# Patient Record
Sex: Female | Born: 1940 | Race: Black or African American | Hispanic: No | Smoking: Former smoker
Health system: Southern US, Community
[De-identification: ages and names within clinical notes are randomized; demographics above are authoritative.]

## PROBLEM LIST (undated history)

## (undated) DIAGNOSIS — Z8639 Personal history of other endocrine, nutritional and metabolic disease: Secondary | ICD-10-CM

## (undated) DIAGNOSIS — I10 Essential (primary) hypertension: Secondary | ICD-10-CM

## (undated) HISTORY — PX: BREAST SURGERY: SHX581

## (undated) HISTORY — PX: ABDOMINAL HYSTERECTOMY: SHX81

## (undated) HISTORY — PX: THYROIDECTOMY, PARTIAL: SHX18

## (undated) HISTORY — PX: BACK SURGERY: SHX140

---

## 1999-08-10 ENCOUNTER — Encounter: Payer: Self-pay | Admitting: Emergency Medicine

## 1999-08-10 ENCOUNTER — Encounter: Payer: Self-pay | Admitting: Cardiology

## 1999-08-10 ENCOUNTER — Inpatient Hospital Stay (HOSPITAL_COMMUNITY): Admission: EM | Admit: 1999-08-10 | Discharge: 1999-08-11 | Payer: Self-pay | Admitting: Emergency Medicine

## 1999-08-11 ENCOUNTER — Encounter: Payer: Self-pay | Admitting: Cardiology

## 2003-11-30 ENCOUNTER — Emergency Department (HOSPITAL_COMMUNITY): Admission: EM | Admit: 2003-11-30 | Discharge: 2003-12-01 | Payer: Self-pay | Admitting: Emergency Medicine

## 2004-08-25 ENCOUNTER — Ambulatory Visit (HOSPITAL_COMMUNITY): Admission: RE | Admit: 2004-08-25 | Discharge: 2004-08-25 | Payer: Self-pay | Admitting: Cardiology

## 2004-12-19 ENCOUNTER — Emergency Department (HOSPITAL_COMMUNITY): Admission: EM | Admit: 2004-12-19 | Discharge: 2004-12-19 | Payer: Self-pay | Admitting: *Deleted

## 2004-12-21 ENCOUNTER — Ambulatory Visit: Payer: Self-pay | Admitting: Internal Medicine

## 2004-12-28 ENCOUNTER — Ambulatory Visit: Admission: RE | Admit: 2004-12-28 | Discharge: 2004-12-28 | Payer: Self-pay | Admitting: Internal Medicine

## 2004-12-28 ENCOUNTER — Ambulatory Visit: Payer: Self-pay | Admitting: Internal Medicine

## 2004-12-28 ENCOUNTER — Encounter (INDEPENDENT_AMBULATORY_CARE_PROVIDER_SITE_OTHER): Payer: Self-pay | Admitting: *Deleted

## 2005-01-01 ENCOUNTER — Encounter (INDEPENDENT_AMBULATORY_CARE_PROVIDER_SITE_OTHER): Payer: Self-pay | Admitting: Specialist

## 2005-01-01 ENCOUNTER — Other Ambulatory Visit: Admission: RE | Admit: 2005-01-01 | Discharge: 2005-01-01 | Payer: Self-pay | Admitting: Interventional Radiology

## 2005-01-01 ENCOUNTER — Encounter: Admission: RE | Admit: 2005-01-01 | Discharge: 2005-01-01 | Payer: Self-pay | Admitting: Otolaryngology

## 2005-01-15 ENCOUNTER — Ambulatory Visit: Payer: Self-pay | Admitting: Internal Medicine

## 2005-02-08 ENCOUNTER — Ambulatory Visit (HOSPITAL_COMMUNITY): Admission: RE | Admit: 2005-02-08 | Discharge: 2005-02-09 | Payer: Self-pay | Admitting: Otolaryngology

## 2005-02-08 ENCOUNTER — Encounter (INDEPENDENT_AMBULATORY_CARE_PROVIDER_SITE_OTHER): Payer: Self-pay | Admitting: Specialist

## 2005-02-12 ENCOUNTER — Ambulatory Visit: Payer: Self-pay | Admitting: Internal Medicine

## 2005-12-20 ENCOUNTER — Inpatient Hospital Stay (HOSPITAL_BASED_OUTPATIENT_CLINIC_OR_DEPARTMENT_OTHER): Admission: RE | Admit: 2005-12-20 | Discharge: 2005-12-20 | Payer: Self-pay | Admitting: Cardiology

## 2006-05-16 ENCOUNTER — Encounter: Admission: RE | Admit: 2006-05-16 | Discharge: 2006-05-16 | Payer: Self-pay | Admitting: Otolaryngology

## 2007-08-21 ENCOUNTER — Encounter: Admission: RE | Admit: 2007-08-21 | Discharge: 2007-08-21 | Payer: Self-pay | Admitting: Gastroenterology

## 2009-06-01 ENCOUNTER — Emergency Department (HOSPITAL_COMMUNITY): Admission: EM | Admit: 2009-06-01 | Discharge: 2009-06-01 | Payer: Self-pay | Admitting: Family Medicine

## 2010-04-02 ENCOUNTER — Encounter: Payer: Self-pay | Admitting: Cardiology

## 2010-07-28 NOTE — Op Note (Signed)
NAME:  [name], [name]              ACCOUNT NO.:  [id number]   MEDICAL RECORD NO.:  [id number]          PATIENT TYPE:  OIB   LOCATION:  5713                         FACILITY:  [facility]   PHYSICIAN:  Hermelinda Medicus, M.D.   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   OPERATION:  Left subtotal thyroidectomy.   OPERATOR:  Hermelinda Medicus, MD   ASSISTANT:  Narda Bonds, MD   ANESTHESIA:  General endotracheal with Dr. Katrinka Blazing.   PROCEDURE:  The patient was placed in supine position, elevating her  shoulders and with the neck exposed, shoulders level and head at the  midline.  The left side will be our approach side for this mass.  The  patient was prepped and draped in the appropriate manner using Betadine and  the usual thyroid head drape was used.  A marking pen was used for a low  thyroid incision and this was carried out using a Bard-Parker blade and then  Bovie electrocoagulation was used for hemostasis and the superior and  inferior flaps of skin and platysma were elevated and the thyroid retractor  was placed.  The midline of the strap muscles was then found, the strap  muscles were separated and the thyroid, which was found to be much larger on  the left side than on the right, was easily identified.  The isthmus was  then found, which was slightly toward the left and the right thyroid gland  was saved and untouched.  The left this side was then mobilized, mobilized  inferiorly using the Bovie coagulation as hemostasis and the small vessels  also were Bovie-electrocoagulated and  tied with 2-0 silk.  The inferior  thyroid artery was visualized and found and tied with 2-0 silk.  The  superior aspect of this thyroid was somewhat adherent; however, we were able  to develop this from its deep location around the esophagus and developed it  up on the back side of the esophagus and we did not have to extend into the  tracheoesophageal groove; we stayed very close to the thyroid tissue and did  not identify the recurrent nerve.  We worked along superiorly, found the  ligament of Allyson Sabal and then we found the superior thyroid vasculature and  crossclamped and cut this, and tied this again with 2-0 silk.  We were able  to dissect this off the trachea without difficulty using the Bovie  electrocoagulation and blunt dissection, and tying with 2-0 silk.  Once this  lesion was removed, we then were very careful to examine the right cut  section of the thyroid at the isthmus and then the inferior and superior  areas where we removed  this mass from, which was quite sizable at 3.9 cm.  Once all hemostasis was established, we did place a small amount of Surgicel  down the inferior and lateral regions and then before that we irrigated and  checked for any hemostasis and used Bovie electrocoagulation to correct any  small problem that was visible.  Also, a 7 flat perforated Jackson-Pratt  drain was placed and then closure was begun using chromic catgut at 2-0 and  then the subcutaneous was closed with chromic catgut and then the skin was  closed with continuous of 5-0 Ethilon.  The Jackson-Pratt was sutured in  place with a 0 silk and then taped in place, and Steri-Strips were applied.  The patient was taken to the recovery room in good  condition and has shown no evidence of any bleeding problems.  The patient  will be kept overnight and monitored, and then hopefully be sent home  tomorrow after removal of the Jackson-Pratt.  The patient tolerated  procedure well and is doing well.   Her followup will be then in 1 week, 3  weeks, 6 weeks, 3 months and 6 months.           ______________________________  Hermelinda Medicus, M.D.     JC/MEDQ  D:  02/08/2005  T:  02/09/2005  Job:  [id number]   cc:   Charlaine Dalton. Sherene Sires, M.D. [facility]  [address]  [place]  [place] [address]   Eduardo Osier. Sharyn Lull, M.D.  Fax: [phone]   Kristine Garbe. Ezzard Standing, M.D.  Fax: [phone]

## 2010-07-28 NOTE — Discharge Summary (Signed)
[place]  Patient:    [name], [name]                     MRN: [id number] Adm. Date:  [id number] Disc. Date: [id number] Attending:  Ronaldo Miyamoto Dictator:   Joellyn Rued, P.A.C.                           Discharge Summary  SUMMARY OF HISTORY:  [name] is a 70 year old black female without prior cardiac history.  She has not seen a physician in more than two years.  Prior to that, he was seeing Dr. Victorino Dike for allergies.  She presents with a history of intermittent sharp, but sometimes pressure-like chest discomfort lasting only seconds, not associated with diaphoresis, shortness of breath, nausea, vomiting, o radiation.  She occasionally notices left shoulder pain and discomfort beneath er left breast, which is less intense and more constant.  They are not associated ith activity or any type of exertion.  She cannot recall any alleviating/aggravating factors.  The intensity and frequency has increased over the last four weeks. Thus, her presentation.  She has a history of hyperlipidemia, last checked at [facility], back surgery, hysterectomy, benign right breast cyst, seasonal allergies.   LABORATORY DATA:  Urinalysis showed a moderate amount of hemoglobin.  Fasting lipid showed total cholesterol 269, triglycerides 121, HDL 74, LDL 171. CKs and troponins were negative for myocardial infarction.  Sodium 139, potassium 4.4, BUN 15, creatinine 0.6, glucose 94.  Hemoglobin 13.1, hematocrit 39.9, normal indices, platelet count 213,000, WBC 6.5.  Chest x-ray did not reveal any abnormalities.  HOSPITAL COURSE:  [name] was admitted to the hospital overnight.  She continued to have some intermittent chest discomfort. Enzymes and EKGs were negative for myocardial infarction.  Stress Cardiolite was performed utilizing the Bruce protocol.  She achieved greater than her 85% predicted maximum heart rate.  She did have a  hypertensive response of 220/100; did have some chest discomfort, resolved with rest.  EKG did not show any changes.  Imaging showed an ejection fraction f 73%, no ischemia.  Thus, she was discharged home with a diagnosis of noncardiac  chest discomfort.  As far as disposition and plan, the discharge sheet with medications and followup is not on the chart. DD:  08/29/99 TD:  08/29/99 Job: [id number]

## 2010-07-28 NOTE — H&P (Signed)
NAME:  [name], [name]              ACCOUNT NO.:  [id number]   MEDICAL RECORD NO.:  [id number]          PATIENT TYPE:  OIB   LOCATION:  2550                         FACILITY:  [facility]   PHYSICIAN:  Hermelinda Medicus, M.D.   DATE OF BIRTH:  July 04, 1940   DATE OF ADMISSION:  02/08/2005  DATE OF DISCHARGE:                                HISTORY & PHYSICAL   This patient is a 70 year old female who I saw who never smoked in the past,  but I saw in the emergency room where she had been coughing up some blood  and this has been checked where her lungs showed some bronchiectasis on CT  scan on December 19, 2004.  Bronchoscopy was performed on October 19,  revealed the airways were widely patent and the subsegmental bilaterally  showed some left erythema.  However, the lungs were cleared of their disease  process including a malignancy.  She also had a large left thyroid lesion  which was pushing against the esophagus and was causing her some swallowing  difficulty and was also pushing her trachea to the right.  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  The plan  is to remove this left thyroid mass in an effort to improve her swallowing  and also resolve the potential tumor process in this increasing size thyroid  mass.   PAST MEDICAL HISTORY:  Allergic to CODEINE.  She drinks alcohol  occasionally.  Never smoked.  She has had a hysterectomy and two back  surgeries.  She does have some hypertension which is well under control on  Toprol XL 100 mg daily.  She has had this bronchiectasis completely worked  up at this point and her bowel, bladder, kidney, respiratory are otherwise  unremarkable.   PHYSICAL EXAMINATION:  VITAL SIGNS:  Blood pressure 136/71 with a heart rate  of 67.  She weighs 125 and has a respiratory rate of 20.  HEENT:  Ears are clear.  Tympanic membranes are clear.  Her external ear  canals are clear.  The nose is clear of any ulceration  or masses as is the  nasopharynx.  Her larynx I can see very well, appears to be pushed to the  right slightly but true vocal cords move well.  True cords, false cords,  epiglottis, base of tongue are clear.  True cord mobility, gag reflex,  tongue mobility, EOMs, facial nerve are all symmetrical.  NECK:  Free of any cervical adenopathy, but shows the thyromegaly on the  left side.  CHEST:  Clear.  No rales, rhonchi, or wheezes.  CARDIOVASCULAR:  No opening snaps, murmurs, or gallops.  EKG noted.  ABDOMEN:  Unremarkable.  EXTREMITIES:  Unremarkable.   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  History of  bronchiectasis.  History of hysterectomy and two back surgeries.  History of  blood pressure elevation.   MEDICATIONS:  1.  Toprol XL 100 mg p.o. daily.  2.  Crestor 10 mg daily.  3.  Mucinex p.r.n.           ______________________________  Hermelinda Medicus, M.D.     JC/MEDQ  D:  02/08/2005  T:  02/08/2005  Job:  [id number]   cc:   Charlaine Dalton. Sherene Sires, M.D. [facility]  [address]  [place]  [place] [address]   Eduardo Osier. Sharyn Lull, M.D.  Fax: [phone]

## 2010-07-28 NOTE — Consult Note (Signed)
NAME:  [name], [name]              ACCOUNT NO.:  [id number]   MEDICAL RECORD NO.:  [id number]          PATIENT TYPE:  EMS   LOCATION:  MAJO                         FACILITY:  [facility]   PHYSICIAN:  Hermelinda Medicus, M.D.   DATE OF BIRTH:  05/24/1940   DATE OF CONSULTATION:  12/19/2004  DATE OF DISCHARGE:  12/19/2004                                   CONSULTATION   This patient is a 70 year old female who has been in excellent health in the  past, has never smoked. She was coming home from work and she felt she was  spitting up some material which turned out to be blood. She brought up  approximately 75 cc of blood into a coffee cup. She had never had this  happen before. She did not have any aspiration or choking spell but was  concerned about this. She came to the emergency room. She brought the cup in  but has not coughed up any more blood and her workup has shown her  hematocrit is 43.0, hemoglobin 14.1, and her white count is 6.9, INR is 0.9.  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 She has had a  chest x-ray which has shown mild bronchitis, pulse oximetry 96-97% and she  at this time on my examination she has had no evidence of any blood in her  throat or nose. She claims she did not ever have any nasal bleeding.   She is allergic to CODEINE.   She takes 81 of aspirin, 50 mg of Toprol XL, 10 mg of Crestor all daily.   She has some abdominal gastrointestinal complaints in the past.   On further examination, her blood pressure is 175/90, pulse 85, and her ears  are clear. Oral cavity appears to be clear. She has dentures. She has no  evidence of any ulceration or mass. Tonsils are clear. Nasopharynx is clear  and is extremely cooperative. Nasopharynx I could see reasonably well. The  larynx is clear of any ulceration or mass. True cords, false cords,  epiglottis, base of tongue, lateral pharyngeal walls all appear to be clear  of any ulceration or mass. I could  see very well total vocal cords, total  lingual laryngeal surface of the epiglottis and I can see right down her  trachea, extremely cooperative with just on mirror examination. Her chest is  clear AP. Her neck is free of any thyromegaly or cervical adenopathy and she  complains of no pain or tenderness. In each examination, I saw no evidence  of any bleeding. I did see a small amount of blood staining in her nose.   I talked with Dr. Lonzo Cloud. Kriste Basque, who will see her tomorrow or one of his  people in pulmonary will see her to see if we can further evaluate. We are  also going to get a CAT scan of her chest and her nasopharynx and larynx to  see if we can spot any point of suspicious area, but as stated this could be  just a bronchitis and that was how the chest x-ray was read out but  she did  not have any severe coughing spell, has had no abuse of her voice, trauma to  her larynx or voice during this time.   DIAGNOSES:  Rule out lesion of chest, nasopharynx, and larynx:  Possible  bronchitis, blood pressure elevation. We will further evaluate with a CAT  scan and then further evaluate by physical exam.           ______________________________  Hermelinda Medicus, M.D.     JC/MEDQ  D:  12/19/2004  T:  12/20/2004  Job:  [id number]   cc:   Eduardo Osier. Sharyn Lull, M.D.  Fax: [phone]   Ricki Rodriguez, M.D.  Fax: [phone]   Lonzo Cloud. Kriste Basque, M.D. [facility]  [address]  [place]  [place] [address]

## 2010-07-28 NOTE — Cardiovascular Report (Signed)
NAME:  [name] [name]              ACCOUNT NO.:  [id number]   MEDICAL RECORD NO.:  [id number]          PATIENT TYPE:  OIB   LOCATION:  1962                         FACILITY:  [facility]   PHYSICIAN:  Mohan N. Sharyn Lull, M.D. DATE OF BIRTH:  09-Jan-1941   DATE OF PROCEDURE:  12/20/2005  DATE OF DISCHARGE:                              CARDIAC CATHETERIZATION   PROCEDURE:  Left cardiac cath with selective left and right coronary  angiography. The LV graphy via right groin using Judkins technique.   INDICATIONS FOR PROCEDURE:  [name] [name] is 70 year old black female with past  medical history significant for hypertension, hypercholesteremia, chronic  bronchiectasis, history of thyroid nodule status post partial thyroidectomy,  complains of recurrent retrosternal chest pain associated with exertion  grade 5/10, relieved with rest. Chest pain is associated with nausea, denies  any diaphoresis.  Denies shortness of breath, palpitation, lightheadedness  or syncope.  The patient also gives history of exertional dyspnea.  Denies  relation of chest pain to food, breathing or movement.  Denies cough, fever,  chills.   PAST MEDICAL HISTORY:  As above.   PAST SURGICAL HISTORY:  She had partial thyroidectomy in the past and had  back surgery for herniated disk many years ago.   ALLERGIES:  She is allergic to CODEINE.   MEDICATIONS AT HOME:  1. Toprol XL 100 mg p.o. daily.  2. Crestor 10 mg p.o. daily.  3. Coated aspirin 81 mg p.o. daily.   SOCIAL HISTORY:  She is married, has one child. Smoked on the weekends for a  few years many years ago.  No history of alcohol abuse.  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   FAMILY HISTORY:  Is positive for cancer.   PHYSICAL EXAMINATION:  GENERAL:  She is alert, awake, and oriented x3 in no  acute distress.  Blood pressure was 150/80, pulse was 74.  HEENT:  Conjunctivae was pink.  NECK:  Supple.  No JVD, no bruit.  LUNGS:  Clear to auscultation  without rhonchi or rales.  HEART:  S1 and S2 was normal.  There was soft systolic murmur at lower left  sternal border.  There was no S3 gallop.  ABDOMEN:  Soft.  Bowel sounds were present, nontender.  EXTREMITIES:  There is no clubbing, cyanosis or edema.   IMPRESSION:  Recurrent chest pain, rule out coronary insufficiency,  hypertension, hypercholesteremia, history of chronic bronchiectasis, status  post thyroid nodules dissection. Discussed with the patient regarding left  cath, its risks and benefits i.e. death, MI, stroke, need for emergency  CABG, local vascular complications. Accepts and consented for the procedure.   PROCEDURE:  After obtaining informed consent the patient was brought to the  cath lab and was placed on fluoroscopy table.  Right groin was prepped and  draped in usual fashion. 2% Xylocaine was used for local anesthesia in the  right groin. With the help of thin-wall needle 4-French arterial sheath was  placed.  The sheath was aspirated and flushed.  Next, 4-French left Judkins  catheter was advanced over the wire under fluoroscopic guidance up to the  ascending aorta.  Wire was pulled out. The catheter was aspirated and  connected to the manifold.  Catheter was further advanced and engaged into  left coronary ostium.  Multiple views of the left system were taken.  Next  the catheter was disengaged and was pulled out over the wire and was  replaced with 4-French 3-D right diagnostic catheter which was advanced over  the wire under fluoroscopic guidance up to the ascending aorta.  Wire was  pulled out. The catheter was aspirated and connected to the manifold.  Catheter was further advanced and engaged into right coronary ostium.  Multiple views of the right system were taken.  Next the catheter was  disengaged and was pulled out over the wire and was replaced with 4-French  pigtail catheter which was advanced over the wire under fluoroscopic  guidance up to the  ascending aorta.  Wire was pulled out. The catheter was  aspirated and connected to the manifold.  Catheter was further advanced  across aortic valve into the LV.  LV pressures were recorded.  Next LV  graphy was done in 30 degrees RAO position.  Post angiographic pressures  were recorded from LV and then pullback pressures were recorded from the  aorta.  There was no gradient across the aortic valve.  Next a pigtail  catheter was pulled out over the wire.  Sheaths aspirated and flushed.   FINDINGS:  LV showed good LV systolic function, EF of 55-60%. Left main was  patent which was short. LAD has 10-15% mid stenosis.  Diagonal one and two  were very small which was patent.  Left circumflex has 5-10% proximal  stenosis.  OM1 is very small.  OM-2 is large which is patent.  RCA has a 10-  15% proximal stenosis.  PDA and PLV branches are patent.  The patient  tolerated procedure well.  There are no complications.  The patient was  transferred to recovery room in stable condition.           ______________________________  Eduardo Osier Sharyn Lull, M.D.     MNH/MEDQ  D:  12/20/2005  T:  12/21/2005  Job:  [id number]

## 2010-07-28 NOTE — Op Note (Signed)
NAME:  [name], [name]              ACCOUNT NO.:  [id number]   MEDICAL RECORD NO.:  [id number]          PATIENT TYPE:  AMB   LOCATION:  CARD                         [facility]ACILITY:  [facility]   PHYSICIAN:  Casimiro Needle B. Sherene Sires, M.D. [employer] OF BIRTH:  07-Jul-1940   DATE OF PROCEDURE:  12/28/2004  DATE OF DISCHARGE:                                 OPERATIVE REPORT   PROCEDURE:  Fiberoptic bronchoscopy with lavage of the left lower lobe.   HISTORY AND INDICATIONS:  Please see attached dictated H&P. This is a  patient with probable longstanding bronchiectasis with new onset hemoptysis  with bronchoscopy indicated to exclude a malignancy atypical of  opportunistic infection involving the left lower lobe where she had focal  findings on recent CT scan. She has not had any hemoptysis for a week now.  No purulent sputum. She agreed to the procedure in the office after a full  discussion of the risks, benefits, and alternatives.   The procedure was performed in bronchoscopy with continuous monitoring by  surface ECG and oximetry. The patient maintained adequate saturations  throughout the procedure in sinus rhythm. She received a total of 5 mg of IV  Versed and 25 mg of IV Demerol.   Using a standard video fiberoptic bronchoscope, the right naris was easily  cannulated with good visualization of the entire oropharynx and larynx. The  cords moved normally and there were no apparent upper airway lesions.   Using an additional 1% lidocaine as needed, the entire tracheobronchial tree  was explored bilaterally with the following findings.   The trachea, carina and all the major airways opened widely to the  subsegmental level. There was mild tortuosity of the airways with mild  erythema on the left greater than the right with minimal retained secretions  that were mucoid not purulent or bloody. However, there were no focal  endobronchial lesions, no obvious sources of hemoptysis.   The left lower lobe was  selectively cannulated and lavaged in the basal  segments with minimal return of serous fluid (not purulent).   The patient tolerated the procedure well. Followup chest x-ray pending.   IMPRESSION:  Bronchiectasis with no evidence of active infection or  hemoptysis at present.   RECOMMENDATIONS:  Await lavage which was sent for cytology, AFB and fungal  stain and culture as well as routine stain and culture. No contraindication  to thyroid surgery is anticipated.           ______________________________  Charlaine Dalton. Sherene Sires, M.D. [facility]     MBW/MEDQ  D:  12/28/2004  T:  12/28/2004  Job:  [id number]   cc:   Hermelinda Medicus, M.D.  Fax: [phone]   Eduardo Osier. Sharyn Lull, M.D.  Fax: [phone]

## 2011-06-27 ENCOUNTER — Other Ambulatory Visit: Payer: Self-pay | Admitting: Cardiology

## 2011-09-16 ENCOUNTER — Other Ambulatory Visit: Payer: Self-pay | Admitting: Cardiology

## 2013-10-15 ENCOUNTER — Other Ambulatory Visit: Payer: Self-pay

## 2013-10-15 DIAGNOSIS — Z1231 Encounter for screening mammogram for malignant neoplasm of breast: Secondary | ICD-10-CM

## 2013-10-22 ENCOUNTER — Ambulatory Visit
Admission: RE | Admit: 2013-10-22 | Discharge: 2013-10-22 | Disposition: A | Discharge status: Home / Self Care | Payer: Managed Care, Other (non HMO) | Source: Ambulatory Visit

## 2013-10-22 DIAGNOSIS — Z1231 Encounter for screening mammogram for malignant neoplasm of breast: Secondary | ICD-10-CM

## 2014-01-04 ENCOUNTER — Other Ambulatory Visit (INDEPENDENT_AMBULATORY_CARE_PROVIDER_SITE_OTHER): Payer: Self-pay

## 2014-01-04 DIAGNOSIS — E079 Disorder of thyroid, unspecified: Secondary | ICD-10-CM

## 2014-01-05 ENCOUNTER — Inpatient Hospital Stay
Admission: RE | Admit: 2014-01-05 | Discharge: 2014-01-05 | Disposition: A | Discharge status: Home / Self Care | Payer: Self-pay | Source: Ambulatory Visit | Attending: Interventional Radiology | Admitting: Interventional Radiology

## 2014-01-05 ENCOUNTER — Other Ambulatory Visit (HOSPITAL_COMMUNITY): Payer: Self-pay | Admitting: Interventional Radiology

## 2014-01-05 DIAGNOSIS — E041 Nontoxic single thyroid nodule: Secondary | ICD-10-CM

## 2014-01-06 ENCOUNTER — Other Ambulatory Visit: Payer: Self-pay | Admitting: Emergency Medicine

## 2014-01-07 ENCOUNTER — Inpatient Hospital Stay: Admission: RE | Admit: 2014-01-07 | Payer: Managed Care, Other (non HMO) | Source: Ambulatory Visit

## 2014-01-14 ENCOUNTER — Encounter (INDEPENDENT_AMBULATORY_CARE_PROVIDER_SITE_OTHER): Payer: Self-pay

## 2014-01-14 ENCOUNTER — Other Ambulatory Visit (HOSPITAL_COMMUNITY)
Admission: RE | Admit: 2014-01-14 | Discharge: 2014-01-14 | Disposition: A | Discharge status: Home / Self Care | Payer: Managed Care, Other (non HMO) | Source: Ambulatory Visit | Attending: Interventional Radiology | Admitting: Interventional Radiology

## 2014-01-14 ENCOUNTER — Ambulatory Visit
Admission: RE | Admit: 2014-01-14 | Discharge: 2014-01-14 | Disposition: A | Discharge status: Home / Self Care | Payer: Managed Care, Other (non HMO) | Source: Ambulatory Visit | Attending: Surgery | Admitting: Surgery

## 2014-01-14 DIAGNOSIS — E042 Nontoxic multinodular goiter: Secondary | ICD-10-CM | POA: Diagnosis present

## 2014-01-14 DIAGNOSIS — E079 Disorder of thyroid, unspecified: Secondary | ICD-10-CM

## 2014-01-28 ENCOUNTER — Telehealth (INDEPENDENT_AMBULATORY_CARE_PROVIDER_SITE_OTHER): Payer: Self-pay

## 2014-01-28 NOTE — Telephone Encounter (Signed)
-----   Message from [name], MD sent at 01/19/2014 10:49 AM EST ----- Needs appt to discuss findings not  Obvious cancer but probably needs to be removed.

## 2014-01-28 NOTE — Telephone Encounter (Signed)
Called pt with results, appt made fro tomorrow

## 2014-01-29 ENCOUNTER — Ambulatory Visit (INDEPENDENT_AMBULATORY_CARE_PROVIDER_SITE_OTHER): Payer: Self-pay | Admitting: Surgery

## 2014-01-29 NOTE — H&P (Signed)
0/26/2015 11:04 AM Location: [facility] Patient #: [id number] DOB: 17-Jan-1941 Married / Language: English / Race: Black or African American Female  History of Present Illness [name]; 01/04/2014 12:23 PM) Patient words: Pt has a large mass on throat  pt sent at the request of Dr Rhys Martini for thyroid mass. Pt unclear how lomg its been there. Seen on exam. U/S done and shows 7.2 cm 4.2 cm 8.6 cm. Had previous left thyroid lobectomy 10 years ago for adenoma. Denies neck pain hoarseness or globus sxs. Has some leftt shoulder pain.  The patient is a 73 year old female who presents with a thyroid nodule. The patient was referred by a primary care provider. Initial presentation was 4 week(s) ago. Presentation included neck lump. Past evaluation has included thyroid Doppler ultrasound. Past treatment has included thyroid lobectomy. No changes in management were made at the last visit. Symptoms include neck lump, while symptoms do not include pain, dysphagia or hoarseness. Symptoms are located in the right neck. The nodule is described as smooth. Onset was gradual. Pertinent medical history includes follicular adenoma. Pertinent family history does not include thyroid malignancy, thyroid disease or goiter.   Allergies Festus Holts, LPN; 47/42/5956 38:75 AM) No Known Drug Allergies10/26/2015  Medication History Festus Holts, LPN; 64/33/2951 88:41 AM) Crestor (20MG  Tablet, Oral) Active.  Vitals Festus Holts LPN; 66/08/3014 01:09 AM) 01/04/2014 11:05 AM Weight: 132.25 lb Height: 63in Body Surface Area: 1.63 m Body Mass Index: 23.43 kg/m Temp.: 98.39F(Temporal)  Pulse: 60 (Regular)  Resp.: 18 (Unlabored)  BP: 140/70 (Sitting, Left Arm, Standard)    Physical Exam ([name]; 01/04/2014 12:25 PM) General Mental Status-Alert. General Appearance-Consistent with stated age. Hydration-Well hydrated. Voice-Normal.  Head  and Neck Neck -Note: transverse cervical incision noted no palpable left lobe.  Thyroid  There is a palpable mass/nodule found and described as follows:: Size - Approximate size: - 7 cm. There is a palpable mass/nodule found and described as follows: - The location of the mass/nodule is the - right mid-lobe. The texture is - firm. Note: not fixed. Upon palpation the mass/nodule is - non-tender. Note: not hoarse  Chest and Lung Exam Chest and lung exam reveals -quiet, even and easy respiratory effort with no use of accessory muscles and on auscultation, normal breath sounds, no adventitious sounds and normal vocal resonance. Inspection Chest Wall - Normal. Back - normal.  Cardiovascular Cardiovascular examination reveals -normal heart sounds, regular rate and rhythm with no murmurs and normal pedal pulses bilaterally.  Neurologic Neurologic evaluation reveals -alert and oriented x 3 with no impairment of recent or remote memory. Mental Status-Normal.  Musculoskeletal Normal Exam - Left-Upper Extremity Strength Normal and Lower Extremity Strength Normal. Normal Exam - Right-Upper Extremity Strength Normal and Lower Extremity Strength Normal.  Lymphatic Head & Neck  General Head & Neck Lymphatics: Bilateral - Description - Normal.    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 Further recs once this is done. She is having no globus, dysphagia or hoarseness so could be followed if benign. Current Plans  Pt Education - instructions: discussed with patient and provided information.   Signed by [name] Daniels, [name] (01/04/2014 12:27 PM)

## 2014-01-29 NOTE — H&P (Signed)
[name] 01/29/2014 11:44 AM Location: [facility] Patient #: [id number] DOB: [birth year] Married / Language: English / Race: Black or African American Female History of Present Illness [name] A. Lanecia Sliva MD; 01/29/2014 12:22 PM) Patient words: discuss bx Pt returns after FNA. the results were reviewed. Pt has no complaints. No hoarseness or other problems.     Diagnosis THYROID, FINE NEEDLE ASPIRATION, RIGHT ISTHMUS (SPECIMEN 1 OF 1, COLLECTED ON 01/14/14): ATYPIA. (BETHESDA CLASS III). ATYPIA OF UNDETERMINED SIGNIFICANCE. SEE COMMENT. [name] MD Pathologist, Electronic Signature (Case signed 01/18/2014) Specimen Clinical.  The patient is a 73 year old female   Allergies [name], Rachel; 01/29/2014 11:45 AM) No Known Drug Allergies 01/04/2014  Medication History ([name], CMA; 01/29/2014 11:45 AM) Crestor (20MG  Tablet, Oral) Active. Toprol XL (100MG  Tablet ER 24HR, Oral) Active. AmLODIPine Besylate (5MG  Tablet, Oral) Active.    Vitals ([name] CMA; 01/29/2014 11:46 AM) 01/29/2014 11:45 AM Weight: 134 lb Height: 63in Body Surface Area: 1.64 m Body Mass Index: 23.74 kg/m Temp.: 76F(Temporal)  Pulse: 72 (Regular)  BP: 126/70 (Sitting, Left Arm, Standard)     Physical Exam (Duayne Brideau A. Della Homan MD; 01/29/2014 12:24 PM)  General Mental Status-Alert. General Appearance-Consistent with stated age. Hydration-Well hydrated. Voice-Normal.  Head and Neck Thyroid -Note:enlarged thyroid lobe noted. scar from previous lobectomy on left. no LA.   Lymphatic Head & Neck  General Head & Neck Lymphatics: Bilateral - Description - Normal.    Assessment & Plan (Jenniger Figiel A. 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 nO HOARSENESS SO WILL NOT PUSUE LARYNGOSCOPY. WILL PROCEED WITH COMPLETION THYROIDECTOMY. RISK  OF BLEEDING, INFECTION, NERVE INJURY, VOICE CHANGES, AIRWAY OBSTRUCTUIN, LOW CALCIUM, MORE SURGERY, INJURY TO OTHER STRUCTURES IN THE NECK, DEATH, DVT , PULMONARY ISSUES. NON OPERATIVE TREATMENT DISCUSSED. SHE WOULD LIKE TO PROCEED.  Current Plans Pt Education - INSTRUCTIONS: discussed with patient and provided information.

## 2014-05-19 ENCOUNTER — Encounter (HOSPITAL_COMMUNITY)
Admission: RE | Admit: 2014-05-19 | Discharge: 2014-05-19 | Disposition: A | Discharge status: Home / Self Care | Payer: Managed Care, Other (non HMO) | Source: Ambulatory Visit | Attending: Surgery | Admitting: Surgery

## 2014-05-19 ENCOUNTER — Encounter (HOSPITAL_COMMUNITY): Payer: Self-pay

## 2014-05-19 DIAGNOSIS — E041 Nontoxic single thyroid nodule: Secondary | ICD-10-CM | POA: Diagnosis not present

## 2014-05-19 DIAGNOSIS — Z01818 Encounter for other preprocedural examination: Secondary | ICD-10-CM | POA: Diagnosis present

## 2014-05-19 HISTORY — DX: Essential (primary) hypertension: I10

## 2014-05-19 HISTORY — DX: Personal history of other endocrine, nutritional and metabolic disease: Z86.39

## 2014-05-19 LAB — COMPREHENSIVE METABOLIC PANEL
ALBUMIN: 3.6 g/dL (ref 3.5–5.2)
ALT: 18 U/L (ref 0–35)
AST: 23 U/L (ref 0–37)
Alkaline Phosphatase: 35 U/L — ABNORMAL LOW (ref 39–117)
Anion gap: 5 (ref 5–15)
BUN: 17 mg/dL (ref 6–23)
CHLORIDE: 106 mmol/L (ref 96–112)
CO2: 32 mmol/L (ref 19–32)
Calcium: 9.8 mg/dL (ref 8.4–10.5)
Creatinine, Ser: 0.64 mg/dL (ref 0.50–1.10)
GFR calc Af Amer: >90 mL/min (ref >90)
GFR calc non Af Amer: 85 mL/min — ABNORMAL LOW (ref >90)
Glucose, Bld: 117 mg/dL — ABNORMAL HIGH (ref 70–99)
Potassium: 3.7 mmol/L (ref 3.5–5.1)
Sodium: 143 mmol/L (ref 135–145)
TOTAL PROTEIN: 6.5 g/dL (ref 6.0–8.3)
Total Bilirubin: 0.4 mg/dL (ref 0.3–1.2)

## 2014-05-19 LAB — CBC WITH DIFFERENTIAL/PLATELET
BASOS ABS: 0 10*3/uL (ref 0.0–0.1)
Basophils Relative: 0 % (ref 0–1)
Eosinophils Absolute: 0.1 10*3/uL (ref 0.0–0.7)
Eosinophils Relative: 1 % (ref 0–5)
HCT: 40.8 % (ref 36.0–46.0)
HEMOGLOBIN: 13.1 g/dL (ref 12.0–15.0)
Lymphocytes Relative: 24 % (ref 12–46)
Lymphs Abs: 1.3 10*3/uL (ref 0.7–4.0)
MCH: 26.1 pg (ref 26.0–34.0)
MCHC: 32.1 g/dL (ref 30.0–36.0)
MCV: 81.3 fL (ref 78.0–100.0)
Monocytes Absolute: 0.3 10*3/uL (ref 0.1–1.0)
Monocytes Relative: 6 % (ref 3–12)
NEUTROS ABS: 3.6 10*3/uL (ref 1.7–7.7)
NEUTROS PCT: 69 % (ref 43–77)
PLATELETS: 201 10*3/uL (ref 150–400)
RBC: 5.02 MIL/uL (ref 3.87–5.11)
RDW: 13.9 % (ref 11.5–15.5)
WBC: 5.3 10*3/uL (ref 4.0–10.5)

## 2014-05-19 NOTE — Progress Notes (Addendum)
Have requested  An old ekg for comparison from [name]  (sees Dr Jaynie Crumble have nothing to compare  [facility] 05/05/2014.  She did have Myoview in 2006 and heart cath in 2007.  Denies any heart problems.      DA

## 2014-05-19 NOTE — H&P (Signed)
H&P   [name] (MR# [id number])      H&P Info    Author Note Status Last Update User Last Update Date/Time   [name], MD Signed [name], MD 01/29/2014 12:29 PM    H&P    Expand All Collapse All   [name] 01/29/2014 11:44 AM Location: [facility] Patient #: [id number] DOB: 03-Dec-1940 Married / Language: English / Race: Black or African American Female History of Present Illness [name] Moores A. Malachi Suderman MD; 01/29/2014 12:22 PM) Patient words: discuss bx Pt returns after FNA. the results were reviewed. Pt has no complaints. No hoarseness or other problems.     Diagnosis THYROID, FINE NEEDLE ASPIRATION, RIGHT ISTHMUS (SPECIMEN 1 OF 1, COLLECTED ON 01/14/14): ATYPIA. (BETHESDA CLASS III). ATYPIA OF UNDETERMINED SIGNIFICANCE. SEE COMMENT. [name] Laws MD Pathologist, Electronic Signature (Case signed 01/18/2014) Specimen Clinical.  The patient is a 74 year old female   Allergies [name], [place]; 01/29/2014 11:45 AM) No Known Drug Allergies 01/04/2014  Medication History ([name], CMA; 01/29/2014 11:45 AM) Crestor (20MG  Tablet, Oral) Active. Toprol XL (100MG  Tablet ER 24HR, Oral) Active. AmLODIPine Besylate (5MG  Tablet, Oral) Active.    Vitals ([name] CMA; 01/29/2014 11:46 AM) 01/29/2014 11:45 AM Weight: 134 lb Height: 63in Body Surface Area: 1.64 m Body Mass Index: 23.74 kg/m Temp.: 59F(Temporal)  Pulse: 72 (Regular)  BP: 126/70 (Sitting, Left Arm, Standard)     Physical Exam ([name] Artiga A. Genevra Orne MD; 01/29/2014 12:24 PM)  General Mental Status-Alert. General Appearance-Consistent with stated age. Hydration-Well hydrated. Voice-Normal.  Head and Neck Thyroid -Note:enlarged thyroid lobe noted. scar from previous lobectomy on left. no LA.   Lymphatic Head & Neck  General Head & Neck Lymphatics: Bilateral - Description - Normal.    Assessment & Plan ([name] Yadav A. 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 nO HOARSENESS SO WILL NOT PUSUE LARYNGOSCOPY. WILL PROCEED WITH COMPLETION THYROIDECTOMY. RISK OF BLEEDING, INFECTION, NERVE INJURY, VOICE CHANGES, AIRWAY OBSTRUCTUIN, LOW CALCIUM, MORE SURGERY, INJURY TO OTHER STRUCTURES IN THE NECK, DEATH, DVT , PULMONARY ISSUES. NON OPERATIVE TREATMENT DISCUSSED. SHE WOULD LIKE TO PROCEED.  Current Plans Pt Education - INSTRUCTIONS: discussed with patient and provided information.

## 2014-05-19 NOTE — Pre-Procedure Instructions (Signed)
[name]  05/19/2014   Your procedure is scheduled on: Tuesday, March 15th   Report to [facility] Admitting at 5:30 AM.  Call this number if you have problems the morning of surgery: [phone]   Remember:   Do not eat food or drink liquids after midnight Monday.   Take these medicines the morning of surgery with A SIP OF WATER: Norvasc, Metoprolol.   Do not wear jewelry, make-up or nail polish.  Do not wear lotions, powders, or perfumes. You may NOT wear deodorant the day of surgery.  Do not shave underarms & legs 48 hours prior to surgery.    Do not bring valuables to the hospital.  [facility] is not responsible for any belongings or valuables.               Contacts, dentures or bridgework may not be worn into surgery.  Leave suitcase in the car. After surgery it may be brought to your room.  For patients admitted to the hospital, discharge time is determined by your treatment team.    Name and phone number of your driver:    Special Instructions: "Preparing for Surgery" instruction sheet.   Please read over the following fact sheets that you were given: Pain Booklet and Surgical Site Infection Prevention

## 2014-05-19 NOTE — H&P (Signed)
H&P   [name] (MR# [id number])      H&P Info    Author Note Status Last Update User Last Update Date/Time   [name] Luna, MD Signed [name] Luna, MD 01/29/2014 12:28 PM    H&P    Expand All Collapse All   0/26/2015 11:04 AM Location: [facility] Patient #: [id number] DOB: 01-09-1941 Married / Language: English / Race: Black or African American Female  History of Present Illness [name] Moores A. [name] MD; 01/04/2014 12:23 PM) Patient words: Pt has a large mass on throat  pt sent at the request of Dr [name] for thyroid mass. Pt unclear how lomg its been there. Seen on exam. U/S done and shows 7.2 cm 4.2 cm 8.6 cm. Had previous left thyroid lobectomy 10 years ago for adenoma. Denies neck pain hoarseness or globus sxs. Has some leftt shoulder pain.  The patient is a 74 year old female who presents with a thyroid nodule. The patient was referred by a primary care provider. Initial presentation was 4 week(s) ago. Presentation included neck lump. Past evaluation has included thyroid Doppler ultrasound. Past treatment has included thyroid lobectomy. No changes in management were made at the last visit. Symptoms include neck lump, while symptoms do not include pain, dysphagia or hoarseness. Symptoms are located in the right neck. The nodule is described as smooth. Onset was gradual. Pertinent medical history includes follicular adenoma. Pertinent family history does not include thyroid malignancy, thyroid disease or goiter.   Allergies [name] Holts, LPN; 58/52/7782 42:35 AM) No Known Drug Allergies10/26/2015  Medication History [name] Holts, LPN; 36/14/4315 40:08 AM) Crestor (20MG  Tablet, Oral) Active.  Vitals [name] Holts LPN; 67/61/9509 32:67 AM) 01/04/2014 11:05 AM Weight: 132.25 lb Height: 63in Body Surface Area: 1.63 m Body Mass Index: 23.43 kg/m Temp.: 98.39F(Temporal)  Pulse: 60 (Regular)  Resp.: 18 (Unlabored)  BP: 140/70  (Sitting, Left Arm, Standard)    Physical Exam ([name] Wilbon A. Novis League MD; 01/04/2014 12:25 PM) General Mental Status-Alert. General Appearance-Consistent with stated age. Hydration-Well hydrated. Voice-Normal.  Head and Neck Neck -Note: transverse cervical incision noted no palpable left lobe.  Thyroid  There is a palpable mass/nodule found and described as follows:: Size - Approximate size: - 7 cm. There is a palpable mass/nodule found and described as follows: - The location of the mass/nodule is the - right mid-lobe. The texture is - firm. Note: not fixed. Upon palpation the mass/nodule is - non-tender. Note: not hoarse  Chest and Lung Exam Chest and lung exam reveals -quiet, even and easy respiratory effort with no use of accessory muscles and on auscultation, normal breath sounds, no adventitious sounds and normal vocal resonance. Inspection Chest Wall - Normal. Back - normal.  Cardiovascular Cardiovascular examination reveals -normal heart sounds, regular rate and rhythm with no murmurs and normal pedal pulses bilaterally.  Neurologic Neurologic evaluation reveals -alert and oriented x 3 with no impairment of recent or remote memory. Mental Status-Normal.  Musculoskeletal Normal Exam - Left-Upper Extremity Strength Normal and Lower Extremity Strength Normal. Normal Exam - Right-Upper Extremity Strength Normal and Lower Extremity Strength Normal.  Lymphatic Head & Neck  General Head & Neck Lymphatics: Bilateral - Description - Normal.    Assessment & Plan ([name] Ridling A. 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 Further recs once this is done. She is having no globus, dysphagia or hoarseness so could be followed if benign. Current Plans  Pt Education -  instructions: discussed with patient and provided information.   Signed by [name] Daniels, MD  (01/04/2014 12:27 PM)

## 2014-05-20 NOTE — Progress Notes (Signed)
Anesthesia Chart Review:  Patient is a 74 year old female scheduled for completion thyroidectomy on 05/25/14 by Dr. Brantley Stage. Recent FNA showed atypica of undetermined significance.  History includes non-smoker, HTN, hypercholesterolemia, hysterectomy, back surgery, left thyroid lobectomy for adenoma '06. She had minimal CAD by 2007 cath. PCP is Dr. Jeanie Cooks.  05/19/14 EKG: SR with first degree AVB, possible LAE, septal infarct (age undetermined). No significant change when compared to prior tracing on 02/06/05 and also previous EKG on 08/10/99 ([facility]).  12/20/05 cardiac cath (Dr. Terrence Dupont): FINDINGS: LV showed good LV systolic function, EF of 79-48%. Left main was patent which was short. LAD has 10-15% mid stenosis. Diagonal one and twowere very small which was patent. Left circumflex has 5-10% proximalstenosis. OM1 is very small. OM-2 is large which is patent. RCA has a 10-15% proximal stenosis. PDA and PLV branches are patent.  Thyroid ultrasound 11/05/13 (Care Everywhere): FINDINGS: There is asymmetry of thyroid lobe size, the right lobe is enlarged measuring 8.2 x 4.9 x 5.9 cm and the left lobe is small measuring 2 x 1.3 x 1.6 cm. Apparent history of previous left thyroid lobe surgery. Thyroid isthmus is enlarged measuring 3.4 cm. There is a large heterogeneous right thyroid lobe mass which also extends into the thyroid isthmus measuring 7.2 x 4.2 x 8.6 cm. IMPRESSION: Large heterogeneous right thyroid lobe/isthmus mass. Recommend biopsy.   Preoperative labs noted. According to Dr. Josetta Huddle 1026/15 notation, he already ordered a TSH and T4 prior to her FNA (results are not in Epic).   If she has not had a recent CXR or CT then she will need a CXR on the day of surgery due to planned thyroidectomy.  If no acute changes then I would anticipate that she could proceed as planned.  George Hugh [facility]/Anesthesiology Phone [phone] 05/20/2014 9:43 AM

## 2014-05-24 MED ORDER — DEXTROSE 5 % IV SOLN
3.0000 g | INTRAVENOUS | Status: AC
  Administered 2014-05-25: 2 g via INTRAVENOUS
  Filled 2014-05-24: qty 3000

## 2014-05-24 MED ORDER — CHLORHEXIDINE GLUCONATE 4 % EX LIQD
1.0000 "application " | Freq: Once | CUTANEOUS | Status: DC
  Filled 2014-05-24: qty 15

## 2014-05-25 ENCOUNTER — Encounter (HOSPITAL_COMMUNITY): Payer: Self-pay | Admitting: *Deleted

## 2014-05-25 ENCOUNTER — Ambulatory Visit (HOSPITAL_COMMUNITY): Payer: Managed Care, Other (non HMO)

## 2014-05-25 ENCOUNTER — Observation Stay (HOSPITAL_COMMUNITY)
Admission: RE | Admit: 2014-05-25 | Discharge: 2014-05-26 | Disposition: A | Discharge status: Home / Self Care | Payer: Managed Care, Other (non HMO) | Source: Ambulatory Visit | Attending: Surgery | Admitting: Surgery

## 2014-05-25 ENCOUNTER — Ambulatory Visit (HOSPITAL_COMMUNITY): Payer: Managed Care, Other (non HMO) | Admitting: Vascular Surgery

## 2014-05-25 ENCOUNTER — Encounter (HOSPITAL_COMMUNITY)
Admission: RE | Disposition: A | Discharge status: Home / Self Care | Payer: Managed Care, Other (non HMO) | Source: Ambulatory Visit | Attending: Surgery

## 2014-05-25 ENCOUNTER — Ambulatory Visit (HOSPITAL_COMMUNITY): Payer: Managed Care, Other (non HMO) | Admitting: Anesthesiology

## 2014-05-25 DIAGNOSIS — Z79899 Other long term (current) drug therapy: Secondary | ICD-10-CM | POA: Diagnosis not present

## 2014-05-25 DIAGNOSIS — E079 Disorder of thyroid, unspecified: Secondary | ICD-10-CM | POA: Diagnosis present

## 2014-05-25 DIAGNOSIS — E041 Nontoxic single thyroid nodule: Principal | ICD-10-CM | POA: Diagnosis present

## 2014-05-25 DIAGNOSIS — I10 Essential (primary) hypertension: Secondary | ICD-10-CM | POA: Insufficient documentation

## 2014-05-25 DIAGNOSIS — Z01811 Encounter for preprocedural respiratory examination: Secondary | ICD-10-CM

## 2014-05-25 HISTORY — PX: THYROIDECTOMY: SHX17

## 2014-05-25 LAB — CBC
HEMATOCRIT: 40.6 % (ref 36.0–46.0)
HEMOGLOBIN: 12.9 g/dL (ref 12.0–15.0)
MCH: 26 pg (ref 26.0–34.0)
MCHC: 31.8 g/dL (ref 30.0–36.0)
MCV: 81.9 fL (ref 78.0–100.0)
Platelets: 191 10*3/uL (ref 150–400)
RBC: 4.96 MIL/uL (ref 3.87–5.11)
RDW: 14.1 % (ref 11.5–15.5)
WBC: 11.8 10*3/uL — ABNORMAL HIGH (ref 4.0–10.5)

## 2014-05-25 LAB — COMPREHENSIVE METABOLIC PANEL
ALK PHOS: 31 U/L — AB (ref 39–117)
ALT: 15 U/L (ref 0–35)
AST: 26 U/L (ref 0–37)
Albumin: 3.4 g/dL — ABNORMAL LOW (ref 3.5–5.2)
Anion gap: 7 (ref 5–15)
BUN: 12 mg/dL (ref 6–23)
CO2: 28 mmol/L (ref 19–32)
Calcium: 9.1 mg/dL (ref 8.4–10.5)
Chloride: 102 mmol/L (ref 96–112)
Creatinine, Ser: 0.7 mg/dL (ref 0.50–1.10)
GFR, EST NON AFRICAN AMERICAN: 83 mL/min — AB (ref >90)
GLUCOSE: 228 mg/dL — AB (ref 70–99)
POTASSIUM: 4.5 mmol/L (ref 3.5–5.1)
Sodium: 137 mmol/L (ref 135–145)
Total Bilirubin: 0.6 mg/dL (ref 0.3–1.2)
Total Protein: 6.3 g/dL (ref 6.0–8.3)

## 2014-05-25 SURGERY — THYROIDECTOMY
Anesthesia: General | Site: Neck

## 2014-05-25 MED ORDER — NEOSTIGMINE METHYLSULFATE 10 MG/10ML IV SOLN
INTRAVENOUS | Status: DC | PRN
  Administered 2014-05-25: 3 mg via INTRAVENOUS

## 2014-05-25 MED ORDER — FENTANYL CITRATE 0.05 MG/ML IJ SOLN
INTRAMUSCULAR | Status: AC
  Filled 2014-05-25: qty 5

## 2014-05-25 MED ORDER — OXYCODONE-ACETAMINOPHEN 5-325 MG PO TABS
1.0000 | ORAL_TABLET | ORAL | Status: DC | PRN
  Administered 2014-05-25 – 2014-05-26 (×2): 2 via ORAL
  Filled 2014-05-25 (×2): qty 2

## 2014-05-25 MED ORDER — ONDANSETRON HCL 4 MG/2ML IJ SOLN
INTRAMUSCULAR | Status: AC
  Filled 2014-05-25: qty 2

## 2014-05-25 MED ORDER — LIDOCAINE HCL (CARDIAC) 20 MG/ML IV SOLN
INTRAVENOUS | Status: AC
  Filled 2014-05-25: qty 10

## 2014-05-25 MED ORDER — LACTATED RINGERS IV SOLN
INTRAVENOUS | Status: DC | PRN
  Administered 2014-05-25 (×2): via INTRAVENOUS

## 2014-05-25 MED ORDER — LEVOTHYROXINE SODIUM 100 MCG PO TABS
100.0000 ug | ORAL_TABLET | Freq: Every day | ORAL | Status: DC
  Administered 2014-05-26: 100 ug via ORAL
  Filled 2014-05-25: qty 1

## 2014-05-25 MED ORDER — HYDROMORPHONE HCL 1 MG/ML IJ SOLN: 0.2500 mg | INTRAMUSCULAR | Status: DC | PRN

## 2014-05-25 MED ORDER — ESMOLOL HCL 10 MG/ML IV SOLN
INTRAVENOUS | Status: DC | PRN
  Administered 2014-05-25: 20 mg via INTRAVENOUS

## 2014-05-25 MED ORDER — HYDROMORPHONE HCL 1 MG/ML IJ SOLN: 1.0000 mg | INTRAMUSCULAR | Status: DC | PRN

## 2014-05-25 MED ORDER — AMLODIPINE BESYLATE 5 MG PO TABS
5.0000 mg | ORAL_TABLET | Freq: Every day | ORAL | Status: DC
  Administered 2014-05-26: 5 mg via ORAL
  Filled 2014-05-25: qty 1

## 2014-05-25 MED ORDER — ONDANSETRON HCL 4 MG/2ML IJ SOLN
INTRAMUSCULAR | Status: DC | PRN
  Administered 2014-05-25: 4 mg via INTRAVENOUS

## 2014-05-25 MED ORDER — PHENYLEPHRINE HCL 10 MG/ML IJ SOLN
INTRAMUSCULAR | Status: DC | PRN
  Administered 2014-05-25 (×2): 80 ug via INTRAVENOUS
  Administered 2014-05-25 (×2): 120 ug via INTRAVENOUS

## 2014-05-25 MED ORDER — ROCURONIUM BROMIDE 100 MG/10ML IV SOLN
INTRAVENOUS | Status: DC | PRN
  Administered 2014-05-25: 50 mg via INTRAVENOUS

## 2014-05-25 MED ORDER — ROSUVASTATIN CALCIUM 20 MG PO TABS
20.0000 mg | ORAL_TABLET | Freq: Every day | ORAL | Status: DC
  Administered 2014-05-25 – 2014-05-26 (×2): 20 mg via ORAL
  Filled 2014-05-25 (×2): qty 1

## 2014-05-25 MED ORDER — CEFAZOLIN SODIUM-DEXTROSE 2-3 GM-% IV SOLR
INTRAVENOUS | Status: AC
  Filled 2014-05-25: qty 50

## 2014-05-25 MED ORDER — MEPERIDINE HCL 25 MG/ML IJ SOLN: 6.2500 mg | INTRAMUSCULAR | Status: DC | PRN

## 2014-05-25 MED ORDER — LIDOCAINE HCL (CARDIAC) 20 MG/ML IV SOLN
INTRAVENOUS | Status: DC | PRN
  Administered 2014-05-25: 100 mg via INTRAVENOUS

## 2014-05-25 MED ORDER — ROCURONIUM BROMIDE 50 MG/5ML IV SOLN
INTRAVENOUS | Status: AC
  Filled 2014-05-25: qty 1

## 2014-05-25 MED ORDER — ENOXAPARIN SODIUM 40 MG/0.4ML ~~LOC~~ SOLN
40.0000 mg | SUBCUTANEOUS | Status: DC
  Administered 2014-05-26: 40 mg via SUBCUTANEOUS
  Filled 2014-05-25: qty 0.4

## 2014-05-25 MED ORDER — METOPROLOL SUCCINATE ER 100 MG PO TB24
100.0000 mg | ORAL_TABLET | Freq: Every day | ORAL | Status: DC
  Administered 2014-05-26: 100 mg via ORAL
  Filled 2014-05-25: qty 1

## 2014-05-25 MED ORDER — ONDANSETRON HCL 4 MG PO TABS: 4.0000 mg | ORAL_TABLET | Freq: Four times a day (QID) | ORAL | Status: DC | PRN

## 2014-05-25 MED ORDER — HEMOSTATIC AGENTS (NO CHARGE) OPTIME
TOPICAL | Status: DC | PRN
  Administered 2014-05-25 (×2): 1 via TOPICAL

## 2014-05-25 MED ORDER — PROPOFOL 10 MG/ML IV BOLUS
INTRAVENOUS | Status: AC
  Filled 2014-05-25: qty 20

## 2014-05-25 MED ORDER — DEXTROSE-NACL 5-0.9 % IV SOLN
INTRAVENOUS | Status: DC
  Administered 2014-05-25: 19:00:00 via INTRAVENOUS

## 2014-05-25 MED ORDER — 0.9 % SODIUM CHLORIDE (POUR BTL) OPTIME
TOPICAL | Status: DC | PRN
  Administered 2014-05-25: 1000 mL

## 2014-05-25 MED ORDER — ONDANSETRON HCL 4 MG/2ML IJ SOLN: 4.0000 mg | Freq: Once | INTRAMUSCULAR | Status: DC | PRN

## 2014-05-25 MED ORDER — GLYCOPYRROLATE 0.2 MG/ML IJ SOLN
INTRAMUSCULAR | Status: DC | PRN
  Administered 2014-05-25: 0.4 mg via INTRAVENOUS
  Administered 2014-05-25: 0.2 mg via INTRAVENOUS

## 2014-05-25 MED ORDER — PHENYLEPHRINE 40 MCG/ML (10ML) SYRINGE FOR IV PUSH (FOR BLOOD PRESSURE SUPPORT)
PREFILLED_SYRINGE | INTRAVENOUS | Status: AC
  Filled 2014-05-25: qty 10

## 2014-05-25 MED ORDER — LIDOCAINE HCL 4 % MT SOLN
OROMUCOSAL | Status: DC | PRN
  Administered 2014-05-25: 4 mL via TOPICAL

## 2014-05-25 MED ORDER — ARTIFICIAL TEARS OP OINT
TOPICAL_OINTMENT | OPHTHALMIC | Status: AC
  Filled 2014-05-25: qty 3.5

## 2014-05-25 MED ORDER — ARTIFICIAL TEARS OP OINT
TOPICAL_OINTMENT | OPHTHALMIC | Status: DC | PRN
  Administered 2014-05-25: 1 via OPHTHALMIC

## 2014-05-25 MED ORDER — FENTANYL CITRATE 0.05 MG/ML IJ SOLN
INTRAMUSCULAR | Status: DC | PRN
  Administered 2014-05-25 (×2): 50 ug via INTRAVENOUS
  Administered 2014-05-25 (×2): 100 ug via INTRAVENOUS
  Administered 2014-05-25 (×3): 50 ug via INTRAVENOUS

## 2014-05-25 MED ORDER — NEOSTIGMINE METHYLSULFATE 10 MG/10ML IV SOLN
INTRAVENOUS | Status: AC
  Filled 2014-05-25: qty 1

## 2014-05-25 MED ORDER — DEXAMETHASONE SODIUM PHOSPHATE 4 MG/ML IJ SOLN
INTRAMUSCULAR | Status: DC | PRN
  Administered 2014-05-25: 8 mg via INTRAVENOUS

## 2014-05-25 MED ORDER — PROPOFOL 10 MG/ML IV BOLUS
INTRAVENOUS | Status: DC | PRN
  Administered 2014-05-25: 120 mg via INTRAVENOUS

## 2014-05-25 MED ORDER — ONDANSETRON HCL 4 MG/2ML IJ SOLN: 4.0000 mg | Freq: Four times a day (QID) | INTRAMUSCULAR | Status: DC | PRN

## 2014-05-25 MED ORDER — DEXAMETHASONE SODIUM PHOSPHATE 4 MG/ML IJ SOLN
INTRAMUSCULAR | Status: AC
  Filled 2014-05-25: qty 2

## 2014-05-25 SURGICAL SUPPLY — 54 items
BLADE SURG 15 STRL LF DISP TIS (BLADE) IMPLANT
BLADE SURG 15 STRL SS (BLADE) ×2
CANISTER SUCTION 2500CC (MISCELLANEOUS) ×2 IMPLANT
CHLORAPREP W/TINT 10.5 ML (MISCELLANEOUS) ×2 IMPLANT
CLIP TI MEDIUM 24 (CLIP) ×2 IMPLANT
CLIP TI WIDE RED SMALL 24 (CLIP) ×2 IMPLANT
CONT SPEC 4OZ CLIKSEAL STRL BL (MISCELLANEOUS) ×1 IMPLANT
COVER SURGICAL LIGHT HANDLE (MISCELLANEOUS) ×2 IMPLANT
CRADLE DONUT ADULT HEAD (MISCELLANEOUS) ×2 IMPLANT
DRAIN SNY 10 ROU (WOUND CARE) ×1 IMPLANT
DRAPE PED LAPAROTOMY (DRAPES) ×2 IMPLANT
DRAPE UTILITY XL STRL (DRAPES) ×4 IMPLANT
DRSG TEGADERM 2-3/8X2-3/4 SM (GAUZE/BANDAGES/DRESSINGS) ×1 IMPLANT
ELECT CAUTERY BLADE 6.4 (BLADE) ×2 IMPLANT
ELECT REM PT RETURN 9FT ADLT (ELECTROSURGICAL) ×2
ELECTRODE REM PT RTRN 9FT ADLT (ELECTROSURGICAL) ×1 IMPLANT
EVACUATOR SILICONE 100CC (DRAIN) ×1 IMPLANT
GAUZE SPONGE 2X2 8PLY STRL LF (GAUZE/BANDAGES/DRESSINGS) IMPLANT
GAUZE SPONGE 4X4 16PLY XRAY LF (GAUZE/BANDAGES/DRESSINGS) ×5 IMPLANT
GLOVE BIO SURGEON STRL SZ8 (GLOVE) ×2 IMPLANT
GLOVE BIOGEL PI IND STRL 6.5 (GLOVE) IMPLANT
GLOVE BIOGEL PI IND STRL 8 (GLOVE) ×1 IMPLANT
GLOVE BIOGEL PI INDICATOR 6.5 (GLOVE) ×1
GLOVE BIOGEL PI INDICATOR 8 (GLOVE) ×1
GLOVE SURG SS PI 6.5 STRL IVOR (GLOVE) ×1 IMPLANT
GLOVE SURG SS PI 8.0 STRL IVOR (GLOVE) ×1 IMPLANT
GOWN STRL REUS W/ TWL LRG LVL3 (GOWN DISPOSABLE) ×3 IMPLANT
GOWN STRL REUS W/ TWL XL LVL3 (GOWN DISPOSABLE) ×1 IMPLANT
GOWN STRL REUS W/TWL LRG LVL3 (GOWN DISPOSABLE) ×6
GOWN STRL REUS W/TWL XL LVL3 (GOWN DISPOSABLE) ×2
HEMOSTAT SNOW SURGICEL 2X4 (HEMOSTASIS) ×3 IMPLANT
KIT BASIN OR (CUSTOM PROCEDURE TRAY) ×2 IMPLANT
KIT ROOM TURNOVER OR (KITS) ×2 IMPLANT
LIQUID BAND (GAUZE/BANDAGES/DRESSINGS) ×2 IMPLANT
NS IRRIG 1000ML POUR BTL (IV SOLUTION) ×2 IMPLANT
PACK SURGICAL SETUP 50X90 (CUSTOM PROCEDURE TRAY) ×2 IMPLANT
PAD ARMBOARD 7.5X6 YLW CONV (MISCELLANEOUS) ×4 IMPLANT
PENCIL BUTTON HOLSTER BLD 10FT (ELECTRODE) ×2 IMPLANT
SHEARS HARMONIC 9CM CVD (BLADE) ×2 IMPLANT
SPECIMEN JAR MEDIUM (MISCELLANEOUS) ×1 IMPLANT
SPONGE GAUZE 2X2 STER 10/PKG (GAUZE/BANDAGES/DRESSINGS) ×1
SPONGE INTESTINAL PEANUT (DISPOSABLE) ×2 IMPLANT
STAPLER VISISTAT 35W (STAPLE) ×2 IMPLANT
SUT ETHILON 2 0 FS 18 (SUTURE) ×1 IMPLANT
SUT MNCRL AB 4-0 PS2 18 (SUTURE) ×2 IMPLANT
SUT VIC AB 2-0 SH 18 (SUTURE) ×2 IMPLANT
SUT VIC AB 3-0 SH 18 (SUTURE) ×2 IMPLANT
SUT VICRYL AB 2 0 TIES (SUTURE) ×2 IMPLANT
SUT VICRYL AB 3 0 TIES (SUTURE) ×2 IMPLANT
SYR BULB 3OZ (MISCELLANEOUS) ×2 IMPLANT
TOWEL OR 17X24 6PK STRL BLUE (TOWEL DISPOSABLE) ×2 IMPLANT
TOWEL OR 17X26 10 PK STRL BLUE (TOWEL DISPOSABLE) ×2 IMPLANT
TUBE CONNECTING 12X1/4 (SUCTIONS) ×2 IMPLANT
WATER STERILE IRR 1000ML POUR (IV SOLUTION) IMPLANT

## 2014-05-25 NOTE — Brief Op Note (Signed)
05/25/2014  10:21 AM  PATIENT:  [name]  74 y.o. female  PRE-OPERATIVE DIAGNOSIS:  THYROID MASS  POST-OPERATIVE DIAGNOSIS:  THYROID MASS  PROCEDURE:  Procedure(s): COMPLETION THYROIDECTOMY (N/A)  SURGEON:  Surgeon(s) and Role:    [name], MD - Primary   ANESTHESIA:   general  EBL:  Total I/O In: 1500 [I.V.:1500] Out: 125 [Blood:125]  BLOOD ADMINISTERED:none  DRAINS: (10) Jackson-Pratt drain(s) with closed bulb suction in the thyroid bed   LOCAL MEDICATIONS USED:  NONE  SPECIMEN:  Source of Specimen:  right thyroid lobe and isthmus  DISPOSITION OF SPECIMEN:  PATHOLOGY  COUNTS:  YES  TOURNIQUET:  * No tourniquets in log *  DICTATION: .Other Dictation: Dictation Number   [id number]  PLAN OF CARE: Admit for overnight observation  PATIENT DISPOSITION:  PACU - hemodynamically stable.   Delay start of Pharmacological VTE agent (>24hrs) due to surgical blood loss or risk of bleeding: yes

## 2014-05-25 NOTE — Anesthesia Preprocedure Evaluation (Signed)
Anesthesia Evaluation  Patient identified by MRN, date of birth, ID band Patient awake    Reviewed: Allergy & Precautions, NPO status , Patient's Chart, lab work & pertinent test results  Airway Mallampati: I  TM Distance: >3 FB Neck ROM: Full    Dental   Pulmonary          Cardiovascular hypertension, Pt. on medications     Neuro/Psych    GI/Hepatic   Endo/Other    Renal/GU      Musculoskeletal   Abdominal   Peds  Hematology   Anesthesia Other Findings   Reproductive/Obstetrics                             Anesthesia Physical Anesthesia Plan  ASA: II  Anesthesia Plan: General   Post-op Pain Management:    Induction: Intravenous  Airway Management Planned: Oral ETT  Additional Equipment:   Intra-op Plan:   Post-operative Plan: Extubation in OR  Informed Consent: I have reviewed the patients History and Physical, chart, labs and discussed the procedure including the risks, benefits and alternatives for the proposed anesthesia with the patient or authorized representative who has indicated his/her understanding and acceptance.     Plan Discussed with: CRNA and Surgeon  Anesthesia Plan Comments:         Anesthesia Quick Evaluation

## 2014-05-25 NOTE — Transfer of Care (Signed)
Immediate Anesthesia Transfer of Care Note  Patient: [name]  Procedure(s) Performed: Procedure(s): COMPLETION THYROIDECTOMY (N/A)  Patient Location: PACU  Anesthesia Type:General  Level of Consciousness: awake, alert , oriented and patient cooperative  Airway & Oxygen Therapy: Patient Spontanous Breathing and Patient connected to nasal cannula oxygen  Post-op Assessment: Report given to RN and Post -op Vital signs reviewed and stable  Post vital signs: Reviewed and stable  Last Vitals:  Filed Vitals:   05/25/14 0614  BP: 148/59  Pulse: 72  Temp: 36.7 C  Resp: 20    Complications: No apparent anesthesia complications

## 2014-05-25 NOTE — Interval H&P Note (Signed)
History and Physical Interval Note:  05/25/2014 7:10 AM  [name]  has presented today for surgery, with the diagnosis of THYROID MASS  The various methods of treatment have been discussed with the patient and family. After consideration of risks, benefits and other options for treatment, the patient has consented to  Procedure(s): COMPLETION THYROIDECTOMY (N/A) as a surgical intervention .  The patient's history has been reviewed, patient examined, no change in status, stable for surgery.  I have reviewed the patient's chart and labs.  Questions were answered to the patient's satisfaction.     Aerika Groll A.

## 2014-05-25 NOTE — Op Note (Signed)
[name], [name]              ACCOUNT NO.:  [id number]  MEDICAL RECORD NO.:  [id number]  LOCATION:  [id number]                        FACILITY:  [facility]  PHYSICIAN:  [name] A. , M.D.DATE OF BIRTH:  Nov 08, 1940  DATE OF PROCEDURE:  05/25/2014 DATE OF DISCHARGE:                              OPERATIVE REPORT   PREOPERATIVE DIAGNOSIS:  Right thyroid lobe mass.  POSTOPERATIVE DIAGNOSIS:  Right thyroid lobe mass.  PROCEDURE:  Completion thyroidectomy.  SURGEON:  [name] A. , M.D.  ANESTHESIA:  General endotracheal anesthesia.  EBL:  125 mL.  IV FLUIDS:  1100 mL of crystalloid.  SPECIMEN:  Right thyroid lobe and isthmus to Pathology.  DRAINS: 1. A 10-French drain. 2. JP drain to thyroid bed.  INDICATIONS FOR PROCEDURE:  The patient is a 74 year old female, seen a number of months ago for an enlarging right thyroid lobe.  FNA showed cytology that was suspicious, but not diagnostic for thyroid cancer.  It was getting larger and causing more compression of her airway as well. She had no hoarseness though.  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  She has no hoarseness or other difficulty.  We talked about options of surgical resection, the risk that are possible with that.  We discussed possible preoperative laryngoscopy, which she had normal and therefore after discussion of the pros and cons of doing that, she opted not to do that. We discussed the risk of completion thyroidectomy to include, but not exclusive of bleeding, infection, injury to recurrent laryngeal nerves, airway problems, tracheal injury, esophagus injury, injury to the parathyroid glands causing low calcium, the need for other operative procedures or reoperation, injury to the carotid artery and carotid sheath, and other structures of the neck that could be injured, possible stroke, possible myocardial infarction, possible airway  problems, possible need for tracheostomy, death, DVT, and other possible side effects of surgery as well.  She understood the above as well as the treatment options and wished to proceed.  DESCRIPTION OF PROCEDURE:  The patient was met in the holding area and her history and physical was updated.  There were no changes.  We discussed the procedures as well as complications with her and her husband.  She answered questions and I asked questions and vice versa. She was satisfied and was ready to go back to the operating room.  She was taken back and placed supine where general anesthesia was initiated in the operating room.  Both arms were tucked.  The neck was prepped and draped in a sterile fashion.  A roll was placed under her shoulders to extend her neck.  Previous transverse surgical scar was noted.  Time-out was done after sterile prep and drape, and she received 2 g of Ancef. The incision was made to the old scar transversely 2 fingerbreadths above the clavicles.  Dissection was carried down through old scar and this was identified and superior and inferior skin flaps were raised. There was scarring in the midline, but the strap muscles were identifiable, and she had a very large right thyroid lobe, which actually went across the midline into the scarred area from the previous left  lobectomy.  This extended down below the sternum.  This extended well up almost to the angle of her mandible.  I was able to elevate the strap muscles in the right side off the mass and thyroid and both the sternal hyoid and omohyoid were elevated, but were very thin attenuated from pressure it looked like.  I was able to begin to slightly mobilize the superior pole, but it was very high and this mass was quite large and very soft feeling.  I mobilized the superior pole taking the superior pole vessels with a combination of clips and Harmonic scalpel without difficulty.  This began to roll down somewhat,  but was very difficult to manipulate.  Middle thyroid vein was taken down with Harmonic scalpel.  We then stayed very close to the thyroid gland since her anatomy seemed quite distorted and her trachea was pushed posterior and to the left.  The mass was mostly anterior to the trachea with very little extension behind the trachea.  I was able to identify the right superior parathyroid gland and preserve it.  I stayed very close to the thyroid gland and mobilized very carefully both superiorly and then inferiorly.  Small branches of the inferior thyroid artery were taken individually with Harmonic scalpel and small clips.  The inferior parathyroid wound was identified, had some bruising, but appeared viable, and we were able to gently dissect this away without devascularizing it from the operative field.  The right recurrent laryngeal nerve was sitting just behind the right inferior thyroid vessels and we were well away from that.  This slowly mobilized upper, but it was very difficult due to the large size of the mass.  The mass was somewhat necrotic and the thyroid gland somewhat separated, but the mass showed lot of significant necrosis with soft and fleshy colored. We did not leave any behind though, we were careful, but the specimen was very difficult to handle.  There was extension behind the sternum for about 2 cm and I was able to use the Harmonic scalpel and dissect this up carefully without injuring the innominate vein.  We ruled this over and then looked to the patient's left strap muscles and mobilized this up because of the significant scar from the previous left thyroid lobectomy.  The mass extended across the midline, but I was able to find the edge of the thyroid gland on this side, mobilized this from the scar that was quite adherent to the strap muscles on the left that appeared to be separation point between strap muscles that stuck down onto this area of the extended  isthmus and right thyroid lobe.  We used the Harmonic scalpel and rolled this up very carefully.  Care was taken to not to get into the tracheoesophageal groove and this was well scarred up.  I did not see any parathyroid or nerves in this side because of scar and chose not to dissect these out at this point.  I was then able to start to dissect the entire specimen off the trachea's anterior wall using Harmonic scalpel.  The entire specimen was removed and the superior pole was labeled.  This was passed off the field.  Irrigation was used and hemostasis was achieved with cautery as well as small clips and Surgicel snow circumferentially.  There was quite a bit of oozing from the scar, and I elected to place a 10-French round drain into this area and secured it to the skin through a separate stab incisions  with 2- 0 nylon.  Once hemostasis was achieved, we approximated the strap muscles with 2-0 Vicryl.  3-0 Vicryl was used to approximate the platysma muscle and 4-0 Monocryl used to close the skin in a subcuticular fashion.  Dermabond applied.  All final counts of sponge, instruments, and needles were found to be correct at this portion of the case.  The patient was awoke, extubated, taken to recovery in a satisfactory condition with all counts being correct.      A. , M.D.     TAC/MEDQ  D:  05/25/2014  T:  05/25/2014  Job:  [id number]

## 2014-05-25 NOTE — Anesthesia Postprocedure Evaluation (Signed)
Anesthesia Post Note  Patient: [name]  Procedure(s) Performed: Procedure(s) (LRB): COMPLETION THYROIDECTOMY (N/A)  Anesthesia type: general  Patient location: PACU  Post pain: Pain level controlled  Post assessment: Patient's Cardiovascular Status Stable  Last Vitals:  Filed Vitals:   05/25/14 1251  BP: 157/61  Pulse: 92  Temp: 37 C  Resp: 16    Post vital signs: Reviewed and stable  Level of consciousness: sedated  Complications: No apparent anesthesia complications

## 2014-05-26 ENCOUNTER — Encounter (HOSPITAL_COMMUNITY): Payer: Self-pay | Admitting: Surgery

## 2014-05-26 DIAGNOSIS — E041 Nontoxic single thyroid nodule: Secondary | ICD-10-CM | POA: Diagnosis not present

## 2014-05-26 LAB — COMPREHENSIVE METABOLIC PANEL
ALT: 13 U/L (ref 0–35)
ANION GAP: 6 (ref 5–15)
AST: 20 U/L (ref 0–37)
Albumin: 3 g/dL — ABNORMAL LOW (ref 3.5–5.2)
Alkaline Phosphatase: 31 U/L — ABNORMAL LOW (ref 39–117)
BUN: 13 mg/dL (ref 6–23)
CALCIUM: 8.8 mg/dL (ref 8.4–10.5)
CO2: 29 mmol/L (ref 19–32)
Chloride: 102 mmol/L (ref 96–112)
Creatinine, Ser: 0.67 mg/dL (ref 0.50–1.10)
GFR calc Af Amer: >90 mL/min (ref >90)
GFR, EST NON AFRICAN AMERICAN: 84 mL/min — AB (ref >90)
Glucose, Bld: 133 mg/dL — ABNORMAL HIGH (ref 70–99)
Potassium: 3.9 mmol/L (ref 3.5–5.1)
SODIUM: 137 mmol/L (ref 135–145)
TOTAL PROTEIN: 5.6 g/dL — AB (ref 6.0–8.3)
Total Bilirubin: 0.8 mg/dL (ref 0.3–1.2)

## 2014-05-26 MED ORDER — LEVOTHYROXINE SODIUM 100 MCG PO TABS: 100.0000 ug | ORAL_TABLET | Freq: Every day | ORAL | Status: DC

## 2014-05-26 MED ORDER — OXYCODONE-ACETAMINOPHEN 5-325 MG PO TABS: 1.0000 | ORAL_TABLET | ORAL | Status: DC | PRN

## 2014-05-26 NOTE — Discharge Instructions (Signed)
GENERAL SURGERY: POST OP INSTRUCTIONS ° °1. DIET: Follow a light bland diet the first 24 hours after arrival home, such as soup, liquids, crackers, etc.  Be sure to include lots of fluids daily.  Avoid fast food or heavy meals as your are more likely to get nauseated.   °2. Take your usually prescribed home medications unless otherwise directed. °3. PAIN CONTROL: °a. Pain is best controlled by a usual combination of three different methods TOGETHER: °i. Ice/Heat °ii. Over the counter pain medication °iii. Prescription pain medication °b. Most patients will experience some swelling and bruising around the incisions.  Ice packs or heating pads (30-60 minutes up to 6 times a day) will help. Use ice for the first few days to help decrease swelling and bruising, then switch to heat to help relax tight/sore spots and speed recovery.  Some people prefer to use ice alone, heat alone, alternating between ice & heat.  Experiment to what works for you.  Swelling and bruising can take several weeks to resolve.   °c. It is helpful to take an over-the-counter pain medication regularly for the first few weeks.  Choose one of the following that works best for you: °i. Naproxen (Aleve, etc)  Two 220mg tabs twice a day °ii. Ibuprofen (Advil, etc) Three 200mg tabs four times a day (every meal & bedtime) °iii. Acetaminophen (Tylenol, etc) 500-650mg four times a day (every meal & bedtime) °d. A  prescription for pain medication (such as oxycodone, hydrocodone, etc) should be given to you upon discharge.  Take your pain medication as prescribed.  °i. If you are having problems/concerns with the prescription medicine (does not control pain, nausea, vomiting, rash, itching, etc), please call us [phone] to see if we need to switch you to a different pain medicine that will work better for you and/or control your side effect better. °ii. If you need a refill on your pain medication, please contact your pharmacy.  They will contact our  office to request authorization. Prescriptions will not be filled after 5 pm or on week-ends. °4. Avoid getting constipated.  Between the surgery and the pain medications, it is common to experience some constipation.  Increasing fluid intake and taking a fiber supplement (such as Metamucil, Citrucel, FiberCon, MiraLax, etc) 1-2 times a day regularly will usually help prevent this problem from occurring.  A mild laxative (prune juice, Milk of Magnesia, MiraLax, etc) should be taken according to package directions if there are no bowel movements after 48 hours.   °5. Wash / shower every day.  You may shower over the dressings as they are waterproof.  Continue to shower over incision(s) after the dressing is off. °6. Remove your waterproof bandages 5 days after surgery.  You may leave the incision open to air.  You may have skin tapes (Steri Strips) covering the incision(s).  Leave them on until one week, then remove.  You may replace a dressing/Band-Aid to cover the incision for comfort if you wish.  ° ° ° ° °7. ACTIVITIES as tolerated:   °a. You may resume regular (light) daily activities beginning the next day--such as daily self-care, walking, climbing stairs--gradually increasing activities as tolerated.  If you can walk 30 minutes without difficulty, it is safe to try more intense activity such as jogging, treadmill, bicycling, low-impact aerobics, swimming, etc. °b. Save the most intensive and strenuous activity for last such as sit-ups, heavy lifting, contact sports, etc  Refrain from any heavy lifting or straining until you   are off narcotics for pain control.   c. DO NOT PUSH THROUGH PAIN.  Let pain be your guide: If it hurts to do something, don't do it.  Pain is your body warning you to avoid that activity for another week until the pain goes down. d. You may drive when you are no longer taking prescription pain medication, you can comfortably wear a seatbelt, and you can safely maneuver your car and  apply brakes. e. Dennis Bast may have sexual intercourse when it is comfortable.  8. FOLLOW UP in our office a. Please call [facility] at [phone] to set up an appointment to see your surgeon in the office for a follow-up appointment approximately 2-3 weeks after your surgery. b. Make sure that you call for this appointment the day you arrive home to insure a convenient appointment time. 9. IF YOU HAVE DISABILITY OR FAMILY LEAVE FORMS, BRING THEM TO THE OFFICE FOR PROCESSING.  DO NOT GIVE THEM TO YOUR DOCTOR.   WHEN TO CALL us [phone]: 1. Poor pain control 2. Reactions / problems with new medications (rash/itching, nausea, etc)  3. Fever over 101.5 F (38.5 C) 4. Worsening swelling or bruising 5. Continued bleeding from incision. 6. Increased pain, redness, or drainage from the incision 7. Difficulty breathing / swallowing   The clinic staff is available to answer your questions during regular business hours (8:30am-5pm).  Please dont hesitate to call and ask to speak to one of our nurses for clinical concerns.   If you have a medical emergency, go to the nearest emergency room or call 911.  A surgeon from [facility] is always on call at the [facility], [place], [place], [place],   [address] ? MAIN: [phone] ? TOLL FREE: [phone] ?  FAX [phone] [email or web address]       May return to work in 2 weeks.

## 2014-05-26 NOTE — Discharge Summary (Signed)
Physician Discharge Summary  Patient ID: [name] MRN: [id number] DOB/AGE: 76-11-1940 76 y.o.  Admit date: 05/25/2014 Discharge date: 05/26/2014  Admission Diagnoses:thyroid nodule   Discharge Diagnoses:  Active Problems:   Thyroid nodule   Discharged Condition: good  Hospital Course: unremarkable.  Pt did well.  Drain removed POD 1 and voice was stri=ong.  Good pain control and calcium normal.  No hematoma.   Consults: None  Significant Diagnostic Studies: labs:  CMP     Component Value Date/Time   NA 137 05/25/2014 1957   K 4.5 05/25/2014 1957   CL 102 05/25/2014 1957   CO2 28 05/25/2014 1957   GLUCOSE 228* 05/25/2014 1957   BUN 12 05/25/2014 1957   CREATININE 0.70 05/25/2014 1957   CALCIUM 9.1 05/25/2014 1957   PROT 6.3 05/25/2014 1957   ALBUMIN 3.4* 05/25/2014 1957   AST 26 05/25/2014 1957   ALT 15 05/25/2014 1957   ALKPHOS 31* 05/25/2014 1957   BILITOT 0.6 05/25/2014 1957   GFRNONAA 83* 05/25/2014 1957   GFRAA >90 05/25/2014 1957     Treatments: surgery: completion thyroidectomy  Discharge Exam: Blood pressure 130/53, pulse 98, temperature 99.3 F (37.4 C), temperature source Oral, resp. rate 16, height 5\' 3"  (1.6 m), weight 62.596 kg (138 lb), SpO2 94 %. Incision/Wound: CDI no hematoma   Voice strong  Disposition:   Discharge Instructions    Diet - low sodium heart healthy    Complete by:  As directed      Discharge instructions    Complete by:  As directed   May return to work in 2 weeks     Increase activity slowly    Complete by:  As directed             Medication List    TAKE these medications        amLODipine 5 MG tablet  Commonly known as:  NORVASC  Take 5 mg by mouth daily.     aspirin EC 81 MG tablet  Take 81 mg by mouth daily.     clobetasol cream 0.05 %  Commonly known as:  TEMOVATE  Apply 1 application topically daily as needed (itching).     GERITOL PO  Take 1 tablet by mouth daily.     levothyroxine 100 MCG  tablet  Commonly known as:  SYNTHROID, LEVOTHROID  Take 1 tablet (100 mcg total) by mouth daily before breakfast.     metoprolol succinate 100 MG 24 hr tablet  Commonly known as:  TOPROL-XL  Take 100 mg by mouth daily. Take with or immediately following a meal.     oxyCODONE-acetaminophen 5-325 MG per tablet  Commonly known as:  PERCOCET/ROXICET  Take 1-2 tablets by mouth every 4 (four) hours as needed for moderate pain.     rosuvastatin 20 MG tablet  Commonly known as:  CRESTOR  Take 20 mg by mouth daily.         Signed: Ramonte Mena A. 05/26/2014, 7:59 AM

## 2014-05-26 NOTE — Progress Notes (Signed)
Discharge paperwork given to patient. Prescription given to patiet. No questions verbalized. Patient is ready for discharged.

## 2014-05-26 NOTE — Progress Notes (Signed)
1 Day Post-Op  Subjective: Doing well minimal drain output voice strong  Objective: Vital signs in last 24 hours: Temp:  [97.9 F (36.6 C)-99.7 F (37.6 C)] 99.3 F (37.4 C) (03/16 0501) Pulse Rate:  [78-102] 98 (03/16 0501) Resp:  [12-21] 16 (03/16 0501) BP: (119-190)/(51-83) 130/53 mmHg (03/16 0501) SpO2:  [94 %-100 %] 94 % (03/16 0501) Last BM Date: 05/24/14  Intake/Output from previous day: 03/15 0701 - 03/16 0700 In: 2875 [P.O.:600; I.V.:2275] Out: 710 [Urine:475; Drains:35; Blood:200] Intake/Output this shift:    Incision/Wound:CDI no hematoma voice strong drain removed.   Lab Results:   Recent Labs  05/25/14 1957  WBC 11.8*  HGB 12.9  HCT 40.6  PLT 191   BMET  Recent Labs  05/25/14 1957  NA 137  K 4.5  CL 102  CO2 28  GLUCOSE 228*  BUN 12  CREATININE 0.70  CALCIUM 9.1   PT/INR No results for input(s): LABPROT, INR in the last 72 hours. ABG No results for input(s): PHART, HCO3 in the last 72 hours.  Invalid input(s): PCO2, PO2  Studies/Results: Dg Chest 2 View  05/25/2014   CLINICAL DATA:  Dyspnea.  New onset cough with fever.  EXAM: CHEST  2 VIEW  COMPARISON:  02/06/2005  FINDINGS: The heart size and mediastinal contours are within normal limits. Both lungs are clear. The visualized skeletal structures are unremarkable.  IMPRESSION: No active cardiopulmonary disease.   Electronically Signed   By: Andreas Newport M.D.   On: 05/25/2014 06:53    Anti-infectives: Anti-infectives    Start     Dose/Rate Route Frequency Ordered Stop   05/25/14 0600  ceFAZolin (ANCEF) 3 g in dextrose 5 % 50 mL IVPB     3 g 160 mL/hr over 30 Minutes Intravenous On call to O.R. 05/24/14 1245 05/25/14 0739      Assessment/Plan: s/p Procedure(s): COMPLETION THYROIDECTOMY (N/A) Discharge     [name] A. 05/26/2014

## 2014-05-28 ENCOUNTER — Inpatient Hospital Stay (HOSPITAL_COMMUNITY)
Admission: EM | Admit: 2014-05-28 | Discharge: 2014-05-30 | DRG: 195 | Disposition: A | Discharge status: Home / Self Care | Payer: Managed Care, Other (non HMO) | Attending: Internal Medicine | Admitting: Internal Medicine

## 2014-05-28 ENCOUNTER — Emergency Department (HOSPITAL_COMMUNITY): Payer: Managed Care, Other (non HMO)

## 2014-05-28 ENCOUNTER — Encounter (HOSPITAL_COMMUNITY): Payer: Self-pay | Admitting: Neurology

## 2014-05-28 DIAGNOSIS — R000 Tachycardia, unspecified: Secondary | ICD-10-CM

## 2014-05-28 DIAGNOSIS — Z7982 Long term (current) use of aspirin: Secondary | ICD-10-CM

## 2014-05-28 DIAGNOSIS — Z8639 Personal history of other endocrine, nutritional and metabolic disease: Secondary | ICD-10-CM

## 2014-05-28 DIAGNOSIS — J189 Pneumonia, unspecified organism: Secondary | ICD-10-CM | POA: Diagnosis not present

## 2014-05-28 DIAGNOSIS — M25369 Other instability, unspecified knee: Secondary | ICD-10-CM

## 2014-05-28 DIAGNOSIS — Z79899 Other long term (current) drug therapy: Secondary | ICD-10-CM

## 2014-05-28 DIAGNOSIS — Y95 Nosocomial condition: Secondary | ICD-10-CM | POA: Diagnosis present

## 2014-05-28 DIAGNOSIS — I10 Essential (primary) hypertension: Secondary | ICD-10-CM | POA: Diagnosis present

## 2014-05-28 DIAGNOSIS — M7989 Other specified soft tissue disorders: Secondary | ICD-10-CM | POA: Diagnosis not present

## 2014-05-28 DIAGNOSIS — E780 Pure hypercholesterolemia: Secondary | ICD-10-CM | POA: Diagnosis present

## 2014-05-28 DIAGNOSIS — M25561 Pain in right knee: Secondary | ICD-10-CM | POA: Diagnosis present

## 2014-05-28 LAB — BASIC METABOLIC PANEL
Anion gap: 9 (ref 5–15)
BUN: 10 mg/dL (ref 6–23)
CALCIUM: 8.7 mg/dL (ref 8.4–10.5)
CO2: 28 mmol/L (ref 19–32)
Chloride: 100 mmol/L (ref 96–112)
Creatinine, Ser: 0.6 mg/dL (ref 0.50–1.10)
GFR calc Af Amer: >90 mL/min (ref >90)
GFR calc non Af Amer: 87 mL/min — ABNORMAL LOW (ref >90)
GLUCOSE: 158 mg/dL — AB (ref 70–99)
Potassium: 4.4 mmol/L (ref 3.5–5.1)
SODIUM: 137 mmol/L (ref 135–145)

## 2014-05-28 LAB — CBC WITH DIFFERENTIAL/PLATELET
Basophils Absolute: 0 10*3/uL (ref 0.0–0.1)
Basophils Relative: 0 % (ref 0–1)
Eosinophils Absolute: 0 10*3/uL (ref 0.0–0.7)
Eosinophils Relative: 0 % (ref 0–5)
HEMATOCRIT: 33.8 % — AB (ref 36.0–46.0)
HEMOGLOBIN: 10.6 g/dL — AB (ref 12.0–15.0)
LYMPHS PCT: 9 % — AB (ref 12–46)
Lymphs Abs: 0.8 10*3/uL (ref 0.7–4.0)
MCH: 25.4 pg — ABNORMAL LOW (ref 26.0–34.0)
MCHC: 31.4 g/dL (ref 30.0–36.0)
MCV: 81.1 fL (ref 78.0–100.0)
MONO ABS: 0.7 10*3/uL (ref 0.1–1.0)
MONOS PCT: 8 % (ref 3–12)
NEUTROS ABS: 6.9 10*3/uL (ref 1.7–7.7)
Neutrophils Relative %: 82 % — ABNORMAL HIGH (ref 43–77)
Platelets: 157 10*3/uL (ref 150–400)
RBC: 4.17 MIL/uL (ref 3.87–5.11)
RDW: 14 % (ref 11.5–15.5)
WBC: 8.4 10*3/uL (ref 4.0–10.5)

## 2014-05-28 LAB — PROTIME-INR
INR: 1.15 (ref 0.00–1.49)
PROTHROMBIN TIME: 14.9 s (ref 11.6–15.2)

## 2014-05-28 LAB — APTT: aPTT: 33 seconds (ref 24–37)

## 2014-05-28 NOTE — Progress Notes (Signed)
*  Preliminary Results* Right lower extremity venous duplex completed. Right lower extremity is negative for deep vein thrombosis. There is no evidence of right Baker's cyst.  05/28/2014 5:58 PM  Maudry Mayhew, RVT, RDCS, RDMS

## 2014-05-28 NOTE — ED Notes (Signed)
Pt reports thyroid surgery on Tuesday. C/o pain behind right knee and swelling to right calf. Denies CP or SOB. Pain to leg when standing. Pt is a x 4

## 2014-05-28 NOTE — ED Provider Notes (Signed)
CSN: [id number]     Arrival date & time 05/28/14  1626 History   First MD Initiated Contact with Patient 05/28/14 2011     Chief Complaint  Patient presents with  . Leg Pain     (Consider location/radiation/quality/duration/timing/severity/associated sxs/prior Treatment) HPI Comments: Patient is a 74 year old female with a past medical history of hypertension who is 3 days s/p thyroidectomy who presents with a 2 day history of pain behind her right knee. Symptoms started gradually and progressively worsened since the onset. The pain is throbbing and severe with radiation up to her right hip. Palpation and weight bearing activity makes the pain worse. No alleviating factors. Patient did not try anything for pain relief. She called her doctor who instructed her to come to the ED for evaluation for DVT.    Past Medical History  Diagnosis Date  . Hypertension   . History of high cholesterol    Past Surgical History  Procedure Laterality Date  . Thyroidectomy, partial    . Back surgery       X 2  "BEEN A WHILE"  . Breast surgery      REMOVAL OF CYST FROM RIGHT BREAST  . Abdominal hysterectomy    . Thyroidectomy N/A 05/25/2014    Procedure: COMPLETION THYROIDECTOMY;  Surgeon: [name], MD;  Location: [facility];  Service: General;  Laterality: N/A;   No family history on file. History  Substance Use Topics  . Smoking status: Never Smoker   . Smokeless tobacco: Not on file  . Alcohol Use: No   OB History    No data available     Review of Systems  Constitutional: Negative for fever, chills and fatigue.  HENT: Negative for trouble swallowing.   Eyes: Negative for visual disturbance.  Respiratory: Negative for shortness of breath.   Cardiovascular: Positive for leg swelling. Negative for chest pain and palpitations.  Gastrointestinal: Negative for nausea, vomiting, abdominal pain and diarrhea.  Genitourinary: Negative for dysuria and difficulty urinating.  Musculoskeletal:  Positive for myalgias and arthralgias. Negative for neck pain.  Skin: Negative for color change.  Neurological: Negative for dizziness and weakness.  Psychiatric/Behavioral: Negative for dysphoric mood.      Allergies  Review of patient's allergies indicates no known allergies.  Home Medications   Prior to Admission medications   Medication Sig Start Date End Date Taking? Authorizing Provider  amLODipine (NORVASC) 5 MG tablet Take 5 mg by mouth daily.   Yes Historical Provider, MD  aspirin EC 81 MG tablet Take 81 mg by mouth daily.   Yes Historical Provider, MD  levothyroxine (SYNTHROID, LEVOTHROID) 100 MCG tablet Take 1 tablet (100 mcg total) by mouth daily before breakfast. 05/26/14  Yes [name], MD  metoprolol succinate (TOPROL-XL) 100 MG 24 hr tablet Take 100 mg by mouth daily. Take with or immediately following a meal.   Yes Historical Provider, MD  oxyCODONE-acetaminophen (PERCOCET/ROXICET) 5-325 MG per tablet Take 1-2 tablets by mouth every 4 (four) hours as needed for moderate pain. 05/26/14  Yes [name], MD  rosuvastatin (CRESTOR) 20 MG tablet Take 20 mg by mouth every evening.    Yes Historical Provider, MD   BP 123/82 mmHg  Pulse 117  Temp(Src) 100 F (37.8 C) (Oral)  Resp 17  SpO2 97% Physical Exam  Constitutional: She is oriented to person, place, and time. She appears well-developed and well-nourished. No distress.  HENT:  Head: Normocephalic and atraumatic.  Eyes: Conjunctivae and EOM are normal.  Neck: Normal range of motion.  Cardiovascular: Regular rhythm.  Exam reveals no gallop and no friction rub.   No murmur heard. tachycardic  Pulmonary/Chest: Effort normal and breath sounds normal. She has no wheezes. She has no rales. She exhibits no tenderness.  Abdominal: Soft. She exhibits no distension. There is no tenderness. There is no rebound.  Musculoskeletal: Normal range of motion.  Right lower leg swelling with right popliteal tenderness to  palpation. Left leg unremarkable. No obvious joint deformity of right knee.   Neurological: She is alert and oriented to person, place, and time. Coordination normal.  Speech is goal-oriented. Moves limbs without ataxia.   Skin: Skin is warm and dry.  Psychiatric: She has a normal mood and affect. Her behavior is normal.  Nursing note and vitals reviewed.   ED Course  Procedures (including critical care time) Labs Review Labs Reviewed  CBC WITH DIFFERENTIAL/PLATELET - Abnormal; Notable for the following:    Hemoglobin 10.6 (*)    HCT 33.8 (*)    MCH 25.4 (*)    Neutrophils Relative % 82 (*)    Lymphocytes Relative 9 (*)    All other components within normal limits  BASIC METABOLIC PANEL - Abnormal; Notable for the following:    Glucose, Bld 158 (*)    GFR calc non Af Amer 87 (*)    All other components within normal limits  PROTIME-INR  APTT    Imaging Review Dg Chest 2 View  05/28/2014   CLINICAL DATA:  Acute tachycardia.  EXAM: CHEST  2 VIEW  COMPARISON:  05/25/2014 and prior radiographs dating back to 2005  FINDINGS: The cardiomediastinal silhouette is unremarkable.  There is no evidence of focal airspace disease, pulmonary edema, suspicious pulmonary nodule/mass, pleural effusion, or pneumothorax. No acute bony abnormalities are identified.  IMPRESSION: No active cardiopulmonary disease.   Electronically Signed   By: Margarette Canada M.D.   On: 05/28/2014 22:04   Ct Angio Chest Pe W/cm &/or Wo Cm  05/29/2014   CLINICAL DATA:  Recent thyroidectomy, leg swelling.  EXAM: CT ANGIOGRAPHY CHEST WITH CONTRAST  TECHNIQUE: Multidetector CT imaging of the chest was performed using the standard protocol during bolus administration of intravenous contrast. Multiplanar CT image reconstructions and MIPs were obtained to evaluate the vascular anatomy.  CONTRAST:  37mL OMNIPAQUE IOHEXOL 350 MG/ML SOLN  COMPARISON:  05/28/2014 chest radiograph, 12/19/2004 CT.  FINDINGS: Mixed attenuation fluid and  locules of air within the thyroid bed.  Suboptimal contrast bolus timing. No pulmonary embolism identified. Some of the peripheral (distal segmental and subsegmental) branches are poorly assessed due to contrast bolus timing and respiratory motion.  Normal caliber thoracic aorta and branch vessels with scattered atherosclerotic disease.  Normal heart size. Trace pericardial fluid. No pleural effusion. No intrathoracic lymphadenopathy.  Upper abdominal images show nothing acute.  Biapical scarring. Central airways are patent. Left lower lobe peripheral bronchi appear mildly thick walled though less distended than on the prior. Some of the peripheral bronchioles may be partially impacted. No pneumothorax. Mild dependent atelectasis.  No acute osseous finding.  Review of the MIP images confirms the above findings.  IMPRESSION: No pulmonary embolism identified. Some of the peripheral vessels are poorly assessed due to contrast bolus timing and respiratory motion.  Postoperative changes of thyroidectomy with mixed attenuation fluid and locules of air in the operative bed. Cannot exclude infection in this setting.  Decreased left lower lobe opacification/peribronchial thickening and impaction.   Electronically Signed   By: Carlos Levering  M.D.   On: 05/29/2014 01:58     EKG Interpretation None      MDM   Final diagnoses:  Tachycardia with 121 - 140 beats per minute  Tachycardia  HCAP (healthcare-associated pneumonia)    9:10 PM Labs unremarkable for acute changes. Patient's DVT study negative. Chest xray and CT angio pending to rule out infection and PE. Patient tachycardic at this time with a low grade temp.   2:58 AM CT angio negative for PE but shows HCAP. Patient started on Vanc and Zosyn and will be admitted.     Alvina Chou, PA-C 05/29/14 0258  Tanna Furry, MD 05/29/14 2214

## 2014-05-29 ENCOUNTER — Emergency Department (HOSPITAL_COMMUNITY): Payer: Managed Care, Other (non HMO)

## 2014-05-29 ENCOUNTER — Encounter (HOSPITAL_COMMUNITY): Payer: Self-pay | Admitting: Radiology

## 2014-05-29 ENCOUNTER — Inpatient Hospital Stay (HOSPITAL_COMMUNITY): Payer: Managed Care, Other (non HMO)

## 2014-05-29 DIAGNOSIS — J189 Pneumonia, unspecified organism: Principal | ICD-10-CM

## 2014-05-29 DIAGNOSIS — I10 Essential (primary) hypertension: Secondary | ICD-10-CM | POA: Diagnosis not present

## 2014-05-29 DIAGNOSIS — Z7982 Long term (current) use of aspirin: Secondary | ICD-10-CM | POA: Diagnosis not present

## 2014-05-29 DIAGNOSIS — Y95 Nosocomial condition: Secondary | ICD-10-CM | POA: Diagnosis present

## 2014-05-29 DIAGNOSIS — M25561 Pain in right knee: Secondary | ICD-10-CM

## 2014-05-29 DIAGNOSIS — Z8639 Personal history of other endocrine, nutritional and metabolic disease: Secondary | ICD-10-CM

## 2014-05-29 DIAGNOSIS — E780 Pure hypercholesterolemia: Secondary | ICD-10-CM | POA: Diagnosis present

## 2014-05-29 DIAGNOSIS — Z79899 Other long term (current) drug therapy: Secondary | ICD-10-CM | POA: Diagnosis not present

## 2014-05-29 DIAGNOSIS — R000 Tachycardia, unspecified: Secondary | ICD-10-CM

## 2014-05-29 LAB — BASIC METABOLIC PANEL
Anion gap: 7 (ref 5–15)
BUN: 7 mg/dL (ref 6–23)
CALCIUM: 9 mg/dL (ref 8.4–10.5)
CO2: 29 mmol/L (ref 19–32)
CREATININE: 0.74 mg/dL (ref 0.50–1.10)
Chloride: 99 mmol/L (ref 96–112)
GFR calc Af Amer: >90 mL/min (ref >90)
GFR calc non Af Amer: 81 mL/min — ABNORMAL LOW (ref >90)
Glucose, Bld: 120 mg/dL — ABNORMAL HIGH (ref 70–99)
POTASSIUM: 3.9 mmol/L (ref 3.5–5.1)
Sodium: 135 mmol/L (ref 135–145)

## 2014-05-29 LAB — CBC
HCT: 34.3 % — ABNORMAL LOW (ref 36.0–46.0)
HEMOGLOBIN: 11 g/dL — AB (ref 12.0–15.0)
MCH: 26 pg (ref 26.0–34.0)
MCHC: 32.1 g/dL (ref 30.0–36.0)
MCV: 81.1 fL (ref 78.0–100.0)
Platelets: 180 10*3/uL (ref 150–400)
RBC: 4.23 MIL/uL (ref 3.87–5.11)
RDW: 14.2 % (ref 11.5–15.5)
WBC: 7.8 10*3/uL (ref 4.0–10.5)

## 2014-05-29 LAB — TSH: TSH: 0.349 u[IU]/mL — ABNORMAL LOW (ref 0.350–4.500)

## 2014-05-29 MED ORDER — ENOXAPARIN SODIUM 40 MG/0.4ML ~~LOC~~ SOLN
40.0000 mg | Freq: Every day | SUBCUTANEOUS | Status: DC
  Administered 2014-05-29: 40 mg via SUBCUTANEOUS
  Filled 2014-05-29 (×3): qty 0.4

## 2014-05-29 MED ORDER — OXYCODONE-ACETAMINOPHEN 5-325 MG PO TABS: 1.0000 | ORAL_TABLET | ORAL | Status: DC | PRN

## 2014-05-29 MED ORDER — VANCOMYCIN HCL 10 G IV SOLR
1250.0000 mg | Freq: Once | INTRAVENOUS | Status: AC
  Administered 2014-05-29: 1250 mg via INTRAVENOUS
  Filled 2014-05-29: qty 1250

## 2014-05-29 MED ORDER — SODIUM CHLORIDE 0.9 % IV SOLN: INTRAVENOUS | Status: DC

## 2014-05-29 MED ORDER — IOHEXOL 350 MG/ML SOLN
75.0000 mL | Freq: Once | INTRAVENOUS | Status: AC | PRN
  Administered 2014-05-29: 75 mL via INTRAVENOUS

## 2014-05-29 MED ORDER — ONDANSETRON HCL 4 MG/2ML IJ SOLN: 4.0000 mg | Freq: Three times a day (TID) | INTRAMUSCULAR | Status: AC | PRN

## 2014-05-29 MED ORDER — HYDROMORPHONE HCL 1 MG/ML IJ SOLN: 0.5000 mg | INTRAMUSCULAR | Status: DC | PRN

## 2014-05-29 MED ORDER — METOPROLOL SUCCINATE ER 100 MG PO TB24
100.0000 mg | ORAL_TABLET | Freq: Every day | ORAL | Status: DC
Start: 2014-05-29 — End: 2014-05-30
  Administered 2014-05-29 – 2014-05-30 (×2): 100 mg via ORAL
  Filled 2014-05-29 (×5): qty 1

## 2014-05-29 MED ORDER — ACETAMINOPHEN 650 MG RE SUPP: 650.0000 mg | Freq: Four times a day (QID) | RECTAL | Status: DC | PRN

## 2014-05-29 MED ORDER — AMLODIPINE BESYLATE 5 MG PO TABS
5.0000 mg | ORAL_TABLET | Freq: Every day | ORAL | Status: DC
  Administered 2014-05-29 – 2014-05-30 (×2): 5 mg via ORAL
  Filled 2014-05-29 (×3): qty 1

## 2014-05-29 MED ORDER — ROSUVASTATIN CALCIUM 20 MG PO TABS
20.0000 mg | ORAL_TABLET | Freq: Every evening | ORAL | Status: DC
  Administered 2014-05-29: 20 mg via ORAL
  Filled 2014-05-29 (×3): qty 1

## 2014-05-29 MED ORDER — LEVOTHYROXINE SODIUM 100 MCG PO TABS
100.0000 ug | ORAL_TABLET | Freq: Every day | ORAL | Status: DC
  Administered 2014-05-29 – 2014-05-30 (×2): 100 ug via ORAL
  Filled 2014-05-29 (×4): qty 1

## 2014-05-29 MED ORDER — OXYCODONE HCL 5 MG PO TABS
5.0000 mg | ORAL_TABLET | ORAL | Status: DC | PRN
  Administered 2014-05-29: 5 mg via ORAL
  Filled 2014-05-29: qty 1

## 2014-05-29 MED ORDER — PIPERACILLIN-TAZOBACTAM 3.375 G IVPB 30 MIN
3.3750 g | Freq: Once | INTRAVENOUS | Status: AC
  Administered 2014-05-29: 3.375 g via INTRAVENOUS
  Filled 2014-05-29: qty 50

## 2014-05-29 MED ORDER — LEVOFLOXACIN 750 MG PO TABS
750.0000 mg | ORAL_TABLET | Freq: Every day | ORAL | Status: DC
  Administered 2014-05-29 – 2014-05-30 (×2): 750 mg via ORAL
  Filled 2014-05-29 (×2): qty 1

## 2014-05-29 MED ORDER — ACETAMINOPHEN 325 MG PO TABS: 650.0000 mg | ORAL_TABLET | Freq: Four times a day (QID) | ORAL | Status: DC | PRN

## 2014-05-29 MED ORDER — ASPIRIN EC 81 MG PO TBEC
81.0000 mg | DELAYED_RELEASE_TABLET | Freq: Every day | ORAL | Status: DC
Start: 2014-05-29 — End: 2014-05-30
  Administered 2014-05-29 – 2014-05-30 (×2): 81 mg via ORAL
  Filled 2014-05-29 (×3): qty 1

## 2014-05-29 MED ORDER — ALUM & MAG HYDROXIDE-SIMETH 200-200-20 MG/5ML PO SUSP: 30.0000 mL | Freq: Four times a day (QID) | ORAL | Status: DC | PRN

## 2014-05-29 NOTE — ED Provider Notes (Signed)
Medical screening examination/treatment/procedure(s) were conducted as a shared visit with non-physician practitioner(s) and myself.  I personally evaluated the patient during the encounter.   EKG Interpretation None       Results for orders placed or performed during the hospital encounter of 05/28/14  CBC with Differential  Result Value Ref Range   WBC 8.4 4.0 - 10.5 K/uL   RBC 4.17 3.87 - 5.11 MIL/uL   Hemoglobin 10.6 (L) 12.0 - 15.0 g/dL   HCT 33.8 (L) 36.0 - 46.0 %   MCV 81.1 78.0 - 100.0 fL   MCH 25.4 (L) 26.0 - 34.0 pg   MCHC 31.4 30.0 - 36.0 g/dL   RDW 14.0 11.5 - 15.5 %   Platelets 157 150 - 400 K/uL   Neutrophils Relative % 82 (H) 43 - 77 %   Neutro Abs 6.9 1.7 - 7.7 K/uL   Lymphocytes Relative 9 (L) 12 - 46 %   Lymphs Abs 0.8 0.7 - 4.0 K/uL   Monocytes Relative 8 3 - 12 %   Monocytes Absolute 0.7 0.1 - 1.0 K/uL   Eosinophils Relative 0 0 - 5 %   Eosinophils Absolute 0.0 0.0 - 0.7 K/uL   Basophils Relative 0 0 - 1 %   Basophils Absolute 0.0 0.0 - 0.1 K/uL  Basic metabolic panel  Result Value Ref Range   Sodium 137 135 - 145 mmol/L   Potassium 4.4 3.5 - 5.1 mmol/L   Chloride 100 96 - 112 mmol/L   CO2 28 19 - 32 mmol/L   Glucose, Bld 158 (H) 70 - 99 mg/dL   BUN 10 6 - 23 mg/dL   Creatinine, Ser 0.60 0.50 - 1.10 mg/dL   Calcium 8.7 8.4 - 10.5 mg/dL   GFR calc non Af Amer 87 (L) >90 mL/min   GFR calc Af Amer >90 >90 mL/min   Anion gap 9 5 - 15  Protime-INR  Result Value Ref Range   Prothrombin Time 14.9 11.6 - 15.2 seconds   INR 1.15 0.00 - 1.49  APTT  Result Value Ref Range   aPTT 33 24 - 37 seconds   Dg Chest 2 View  05/28/2014   CLINICAL DATA:  Acute tachycardia.  EXAM: CHEST  2 VIEW  COMPARISON:  05/25/2014 and prior radiographs dating back to 2005  FINDINGS: The cardiomediastinal silhouette is unremarkable.  There is no evidence of focal airspace disease, pulmonary edema, suspicious pulmonary nodule/mass, pleural effusion, or pneumothorax. No acute bony  abnormalities are identified.  IMPRESSION: No active cardiopulmonary disease.   Electronically Signed   By: Margarette Canada M.D.   On: 05/28/2014 22:04   Dg Chest 2 View  05/25/2014   CLINICAL DATA:  Dyspnea.  New onset cough with fever.  EXAM: CHEST  2 VIEW  COMPARISON:  02/06/2005  FINDINGS: The heart size and mediastinal contours are within normal limits. Both lungs are clear. The visualized skeletal structures are unremarkable.  IMPRESSION: No active cardiopulmonary disease.   Electronically Signed   By: Andreas Newport M.D.   On: 05/25/2014 06:53   Ct Angio Chest Pe W/cm &/or Wo Cm  05/29/2014   CLINICAL DATA:  Recent thyroidectomy, leg swelling.  EXAM: CT ANGIOGRAPHY CHEST WITH CONTRAST  TECHNIQUE: Multidetector CT imaging of the chest was performed using the standard protocol during bolus administration of intravenous contrast. Multiplanar CT image reconstructions and MIPs were obtained to evaluate the vascular anatomy.  CONTRAST:  15mL OMNIPAQUE IOHEXOL 350 MG/ML SOLN  COMPARISON:  05/28/2014 chest  radiograph, 12/19/2004 CT.  FINDINGS: Mixed attenuation fluid and locules of air within the thyroid bed.  Suboptimal contrast bolus timing. No pulmonary embolism identified. Some of the peripheral (distal segmental and subsegmental) branches are poorly assessed due to contrast bolus timing and respiratory motion.  Normal caliber thoracic aorta and branch vessels with scattered atherosclerotic disease.  Normal heart size. Trace pericardial fluid. No pleural effusion. No intrathoracic lymphadenopathy.  Upper abdominal images show nothing acute.  Biapical scarring. Central airways are patent. Left lower lobe peripheral bronchi appear mildly thick walled though less distended than on the prior. Some of the peripheral bronchioles may be partially impacted. No pneumothorax. Mild dependent atelectasis.  No acute osseous finding.  Review of the MIP images confirms the above findings.  IMPRESSION: No pulmonary embolism  identified. Some of the peripheral vessels are poorly assessed due to contrast bolus timing and respiratory motion.  Postoperative changes of thyroidectomy with mixed attenuation fluid and locules of air in the operative bed. Cannot exclude infection in this setting.  Decreased left lower lobe opacification/peribronchial thickening and impaction.   Electronically Signed   By: Carlos Levering M.D.   On: 05/29/2014 01:58    Patient seen by me. Patient status post thyroidectomy by Dr. Brantley Stage. Our patient presented with concerns for leg swelling. Doppler studies of the leg negative for deep vein thrombosis. Patient had CT angiogram done to rule out a PE. CT NGO of raises concerns for left lower lobe pneumonia. Will start patient on a hospital-acquired pneumonia meds and arrange for admission. Patient with a recent thyroid being removed at possible risk for needing additional thyroid medicine. Also patient with a persistent tachycardia currently. And a little bit of a low-grade fever. Patient is alert and oriented no acute distress. Thyroid incision. To be healing well. A little bit of oozing around the incision area.  Fredia Sorrow, MD 05/29/14 0300

## 2014-05-29 NOTE — H&P (Signed)
[facility] Admission History and Physical       [name] FAO:[id number] DOB: 01-18-1941 DOA: 05/28/2014  Referring physician: EDP PCP: Philis Fendt, MD  Specialists:   Chief Complaint: Right Leg pain and Swelling. Cough and SOB  HPI: [name] is a 74 y.o. female with recent Partial Thyroidectomy on 05/25/2014 who presents to the ED with complaints of RLE pain and Swelling , along with cough and SOB .  And Ultrasoound of the RLE and CTA of the Chest were performed and were negative for thrombus, however a pneumonia was seen, and she was placed on IV Vancomycin and Zosyn to cover a HCAP Pneumonia.     Review of Systems:  Constitutional: No Weight Loss, No Weight Gain, Night Sweats, Fevers, Chills, Dizziness, Light Headedness, Fatigue, or Generalized Weakness HEENT: No Headaches, Difficulty Swallowing,Tooth/Dental Problems,Sore Throat,  No Sneezing, Rhinitis, Ear Ache, Nasal Congestion, or Post Nasal Drip,  Cardio-vascular:  No Chest pain, Orthopnea, PND, Edema in Lower Extremities, Anasarca, Dizziness, Palpitations  Resp: No Dyspnea, No DOE, +Productive Cough, No Non-Productive Cough, No Hemoptysis, No Wheezing.    GI: No Heartburn, Indigestion, Abdominal Pain, Nausea, Vomiting, Diarrhea, Constipation, Hematemesis, Hematochezia, Melena, Change in Bowel Habits,  Loss of Appetite  GU: No Dysuria, No Change in Color of Urine, No Urgency or Urinary Frequency, No Flank pain.  Musculoskeletal: No Joint Pain or Swelling, No Decreased Range of Motion, No Back Pain.  Neurologic: No Syncope, No Seizures, Muscle Weakness, Paresthesia, Vision Disturbance or Loss, No Diplopia, No Vertigo, No Difficulty Walking,  Skin: No Rash or Lesions. Psych: No Change in Mood or Affect, No Depression or Anxiety, No Memory loss, No Confusion, or Hallucinations   Past Medical History  Diagnosis Date  . Hypertension   . History of high cholesterol      Past Surgical History    Procedure Laterality Date  . Thyroidectomy, partial    . Back surgery       X 2  "BEEN A WHILE"  . Breast surgery      REMOVAL OF CYST FROM RIGHT BREAST  . Abdominal hysterectomy    . Thyroidectomy N/A 05/25/2014    Procedure: COMPLETION THYROIDECTOMY;  Surgeon: [name], MD;  Location: [facility];  Service: General;  Laterality: N/A;      Prior to Admission medications   Medication Sig Start Date End Date Taking? Authorizing Provider  amLODipine (NORVASC) 5 MG tablet Take 5 mg by mouth daily.   Yes Historical Provider, MD  aspirin EC 81 MG tablet Take 81 mg by mouth daily.   Yes Historical Provider, MD  levothyroxine (SYNTHROID, LEVOTHROID) 100 MCG tablet Take 1 tablet (100 mcg total) by mouth daily before breakfast. 05/26/14  Yes [name], MD  metoprolol succinate (TOPROL-XL) 100 MG 24 hr tablet Take 100 mg by mouth daily. Take with or immediately following a meal.   Yes Historical Provider, MD  oxyCODONE-acetaminophen (PERCOCET/ROXICET) 5-325 MG per tablet Take 1-2 tablets by mouth every 4 (four) hours as needed for moderate pain. 05/26/14  Yes [name], MD  rosuvastatin (CRESTOR) 20 MG tablet Take 20 mg by mouth every evening.    Yes Historical Provider, MD     No Known Allergies  Social History:  reports that she has never smoked. She does not have any smokeless tobacco history on file. She reports that she does not drink alcohol or use illicit drugs.    No family history on file.     Physical Exam:  GEN:  Pleasant Elderly Thin  74 y.o. African American female examined and in no acute distress; cooperative with exam Filed Vitals:   05/29/14 0215 05/29/14 0245 05/29/14 0315 05/29/14 0359  BP: 145/55 149/61 154/66 165/57  Pulse: 118 118 116 120  Temp:    99.8 F (37.7 C)  TempSrc:    Oral  Resp: 22 22 21 20   Height:    5\' 3"  (1.6 m)  Weight:    58.968 kg (130 lb)  SpO2: 93% 95% 96% 94%   Blood pressure 165/57, pulse 120, temperature 99.8 F (37.7 C),  temperature source Oral, resp. rate 20, height 5\' 3"  (1.6 m), weight 58.968 kg (130 lb), SpO2 94 %. PSYCH: She is alert and oriented x4; does not appear anxious does not appear depressed; affect is normal HEENT: Normocephalic and Atraumatic, Mucous membranes pink; PERRLA; EOM intact; Fundi:  Benign;  No scleral icterus, Nares: Patent, Oropharynx: Clear, Fair Dentition,    Neck:  FROM, No Cervical Lymphadenopathy nor Thyromegaly or Carotid Bruit; No JVD; Breasts:: Not examined CHEST WALL: No tenderness CHEST: Normal respiration, clear to auscultation bilaterally HEART: Tachycardic and Regular Rhythm; no murmurs rubs or gallops BACK: No kyphosis or scoliosis; No CVA tenderness ABDOMEN: Positive Bowel Sounds, Scaphoid, Soft Non-Tender, No Rebound or Guarding; No Masses, No Organomegaly. Rectal Exam: Not done EXTREMITIES: No Cyanosis, Clubbing, or Edema; No Ulcerations. Genitalia: not examined PULSES: 2+ and symmetric SKIN: Normal hydration no rash or ulceration CNS:  Alert and Oriented x 4, No Focal Deficits Vascular: pulses palpable throughout    Labs on Admission:  Basic Metabolic Panel:  Recent Labs Lab 05/25/14 1957 05/26/14 0605 05/28/14 1728  NA 137 137 137  K 4.5 3.9 4.4  CL 102 102 100  CO2 28 29 28   GLUCOSE 228* 133* 158*  BUN 12 13 10   CREATININE 0.70 0.67 0.60  CALCIUM 9.1 8.8 8.7   Liver Function Tests:  Recent Labs Lab 05/25/14 1957 05/26/14 0605  AST 26 20  ALT 15 13  ALKPHOS 31* 31*  BILITOT 0.6 0.8  PROT 6.3 5.6*  ALBUMIN 3.4* 3.0*   No results for input(s): LIPASE, AMYLASE in the last 168 hours. No results for input(s): AMMONIA in the last 168 hours. CBC:  Recent Labs Lab 05/25/14 1957 05/28/14 1728  WBC 11.8* 8.4  NEUTROABS  --  6.9  HGB 12.9 10.6*  HCT 40.6 33.8*  MCV 81.9 81.1  PLT 191 157   Cardiac Enzymes: No results for input(s): CKTOTAL, CKMB, CKMBINDEX, TROPONINI in the last 168 hours.  BNP (last 3 results) No results for  input(s): BNP in the last 8760 hours.  ProBNP (last 3 results) No results for input(s): PROBNP in the last 8760 hours.  CBG: No results for input(s): GLUCAP in the last 168 hours.  Radiological Exams on Admission: Dg Chest 2 View  05/28/2014   CLINICAL DATA:  Acute tachycardia.  EXAM: CHEST  2 VIEW  COMPARISON:  05/25/2014 and prior radiographs dating back to 2005  FINDINGS: The cardiomediastinal silhouette is unremarkable.  There is no evidence of focal airspace disease, pulmonary edema, suspicious pulmonary nodule/mass, pleural effusion, or pneumothorax. No acute bony abnormalities are identified.  IMPRESSION: No active cardiopulmonary disease.   Electronically Signed   By: Margarette Canada M.D.   On: 05/28/2014 22:04   Ct Angio Chest Pe W/cm &/or Wo Cm  05/29/2014   CLINICAL DATA:  Recent thyroidectomy, leg swelling.  EXAM: CT ANGIOGRAPHY CHEST WITH CONTRAST  TECHNIQUE: Multidetector CT  imaging of the chest was performed using the standard protocol during bolus administration of intravenous contrast. Multiplanar CT image reconstructions and MIPs were obtained to evaluate the vascular anatomy.  CONTRAST:  9mL OMNIPAQUE IOHEXOL 350 MG/ML SOLN  COMPARISON:  05/28/2014 chest radiograph, 12/19/2004 CT.  FINDINGS: Mixed attenuation fluid and locules of air within the thyroid bed.  Suboptimal contrast bolus timing. No pulmonary embolism identified. Some of the peripheral (distal segmental and subsegmental) branches are poorly assessed due to contrast bolus timing and respiratory motion.  Normal caliber thoracic aorta and branch vessels with scattered atherosclerotic disease.  Normal heart size. Trace pericardial fluid. No pleural effusion. No intrathoracic lymphadenopathy.  Upper abdominal images show nothing acute.  Biapical scarring. Central airways are patent. Left lower lobe peripheral bronchi appear mildly thick walled though less distended than on the prior. Some of the peripheral bronchioles may be  partially impacted. No pneumothorax. Mild dependent atelectasis.  No acute osseous finding.  Review of the MIP images confirms the above findings.  IMPRESSION: No pulmonary embolism identified. Some of the peripheral vessels are poorly assessed due to contrast bolus timing and respiratory motion.  Postoperative changes of thyroidectomy with mixed attenuation fluid and locules of air in the operative bed. Cannot exclude infection in this setting.  Decreased left lower lobe opacification/peribronchial thickening and impaction.   Electronically Signed   By: Carlos Levering M.D.   On: 05/29/2014 01:58       Assessment/Plan:   73 y.o. female with   Principal Problem:   1.   HCAP (healthcare-associated pneumonia)   IV Vancomycin and Zosyn   Albuterol Nebs '  O2 PRN '  Monitor O2 Sats   Active Problems:   2.   Tachycardia   Continue  Metoprolol Rx   PRN IV Metoprolol for HR > 120   IVFs        3.   Thyroidectomy- Wound Clean and without Signs of Infection   Notify ([facility])General Surgery On Call For Dr.Cornett     4.   Hypertension   Continue Metoprolol and Amlodipine   Monitor BPs    5.   History of high cholesterol   Continue Crestor Rx       6.   DVT Prophylaxis   Lovenox        Code Status:     FULL CODE     Family Communication:   Husband at Bedside    Disposition Plan:    Inpatient Status        Time spent:  [facility] Pager [phone]   If [place] Please Contact the Day Rounding Team MD for [facility]  If 7PM-7AM, Please Contact Night-Floor Coverage  [email or web address] Password [email or web address] 05/29/2014, 4:10 AM     ADDENDUM:   Patient was seen and examined on 05/29/2014

## 2014-05-29 NOTE — Progress Notes (Addendum)
   Follow Up Note  Pt admitted earlier this morning.  Seen after arrived to floor.    Exam: CV: Regular rate and rhythm, borderline tachycardia Lungs: Decreased breath sounds left base Abd: Soft, nontender, nondistended, positive bowel sounds Ext: No clubbing or cyanosis or edema. Some right knee instability, anterior drawer sign.  Present on Admission:  . HCAP (healthcare-associated pneumonia): Looks to be stable on room air, change in her antibiotics by mouth. Check ambulatory pulse ox  . Tachycardia: Does not appear from pain or hypoxia. May have missed yesterday's dose of Toprol. Checking TSH  . Hypertension: Continue home meds  . Right knee pain: Knee x-ray unrevealing. We'll get PT to see, may need home health. Follow up with or without was outpatient. Likely home tomorrow.

## 2014-05-30 MED ORDER — LEVOFLOXACIN 750 MG PO TABS: 750.0000 mg | ORAL_TABLET | Freq: Every day | ORAL | Status: AC

## 2014-05-30 NOTE — Evaluation (Signed)
Physical Therapy Evaluation and Discharge Patient Details Name: [name] MRN: [id number] DOB: May 15, 1940 Today's Date: 05/30/2014   History of Present Illness  Patient is a 74 year old female with a past medical history of hypertension who is 3 days s/p thyroidectomy who presents with a 2 day history of pain behind her right knee. Symptoms started gradually and progressively worsened since the onset. The pain is throbbing and severe with radiation up to her right hip. Palpation and weight bearing activity makes the pain worse  Clinical Impression  Patient evaluated by Physical Therapy with no further acute PT needs identified. All education has been completed and the patient has no further questions. Ambulates generally well at a supervision level while using a rolling walker for support. SpO2 97% on room air while ambulating. Rt knee TTP with limited extension and painful end range. Patient's husband is available for assistance at home. Recommend OP PT follow up for Rt knee pain. See below for any follow-up Physial Therapy or equipment needs. PT is signing off. Thank you for this referral.     Follow Up Recommendations Outpatient PT    Equipment Recommendations  Rolling walker with 5" wheels    Recommendations for Other Services       Precautions / Restrictions Precautions Precautions: Fall Restrictions Weight Bearing Restrictions: No      Mobility  Bed Mobility               General bed mobility comments: Sitting edge of bed at start of therapy  Transfers Overall transfer level: Needs assistance Equipment used: Rolling walker (2 wheeled) Transfers: Sit to/from Stand Sit to Stand: Supervision         General transfer comment: Supervision for safety. Tends to extend Rt knee as a guarding mechanism to rise from bed and low chair with arm rest. Cues for hand placement and technique  Ambulation/Gait Ambulation/Gait assistance: Supervision Ambulation Distance  (Feet): 125 Feet Assistive device: Rolling walker (2 wheeled) Gait Pattern/deviations: Step-through pattern;Decreased stride length;Decreased dorsiflexion - right;Antalgic;Decreased stance time - right;Trunk flexed Gait velocity: decreased   General Gait Details: SpO2 97% on room air during bout. Educated on safe DME use with a rolling walker. Focused on improving Rt foot clearance which she responded well to. No buckling during bout. Mildly antalgic pattern. VC for upright posture and forward gaze intermittently.  Stairs            Wheelchair Mobility    Modified Rankin (Stroke Patients Only)       Balance Overall balance assessment: Needs assistance Sitting-balance support: No upper extremity supported;Feet supported Sitting balance-Leahy Scale: Good     Standing balance support: No upper extremity supported Standing balance-Leahy Scale: Fair                               Pertinent Vitals/Pain Pain Assessment: 0-10 Pain Score: 7  Pain Location: right knee Pain Descriptors / Indicators: Sore Pain Intervention(s): Monitored during session;Repositioned  HR 101 while ambulating    Home Living Family/patient expects to be discharged to:: Private residence Living Arrangements: Spouse/significant other;Children Available Help at Discharge: Family Type of Home: House Home Access: Ramped entrance     Home Layout: One level Home Equipment: Cane - single point;Shower seat      Prior Function Level of Independence: Independent               Hand Dominance   Dominant Hand: Right  Extremity/Trunk Assessment   Upper Extremity Assessment: Defer to OT evaluation           Lower Extremity Assessment: RLE deficits/detail RLE Deficits / Details: limited right knee extension with pain at end range. TTP over medial hamstring tendon and medial joint line of knee. MMT of Rt knee extension 4/5 with reported pain. Rt ankle dorsiflexion MMT 5/5        Communication   Communication: No difficulties  Cognition Arousal/Alertness: Awake/alert Behavior During Therapy: WFL for tasks assessed/performed Overall Cognitive Status: Within Functional Limits for tasks assessed                      General Comments General comments (skin integrity, edema, etc.): RLE appears to be more edematous than Lt. No tenderness to calf.    Exercises General Exercises - Lower Extremity Ankle Circles/Pumps: AROM;Right;10 reps;Seated Other Exercises Other Exercises: seated hamstring stretch x30 seconds      Assessment/Plan    PT Assessment All further PT needs can be met in the next venue of care  PT Diagnosis Abnormality of gait;Acute pain   PT Problem List Decreased strength;Decreased range of motion;Decreased activity tolerance;Decreased balance;Decreased mobility;Decreased knowledge of use of DME;Pain  PT Treatment Interventions     PT Goals (Current goals can be found in the Care Plan section) Acute Rehab PT Goals Patient Stated Goal: go home PT Goal Formulation: All assessment and education complete, DC therapy    Frequency     Barriers to discharge        Co-evaluation               End of Session Equipment Utilized During Treatment: Gait belt Activity Tolerance: Patient tolerated treatment well Patient left: in chair;with call bell/phone within reach Nurse Communication: Mobility status         Time: 0272-5366 PT Time Calculation (min) (ACUTE ONLY): 27 min   Charges:   PT Evaluation $Initial PT Evaluation Tier I: 1 Procedure PT Treatments $Gait Training: 8-22 mins   PT G CodesEllouise Newer 05/30/2014, 9:55 AM Elayne Snare, [place]

## 2014-05-30 NOTE — Progress Notes (Signed)
Discharged to home with family office visits in place teaching done  

## 2014-05-31 LAB — T4, FREE: Free T4: 1.31 ng/dL (ref 0.80–1.80)

## 2014-05-31 NOTE — Care Management Note (Signed)
    Page 1 of 1   05/31/2014     3:18:40 PM CARE MANAGEMENT NOTE 05/31/2014  Patient:  [name],[name]   Account Number:  [id number]  Date Initiated:  05/31/2014  Documentation initiated by:  Marvetta Gibbons  Subjective/Objective Assessment:   admitted with right knee pain, HCAP     Action/Plan:   PTA Pt lived at home- PT eval recommended outpt PT   Anticipated DC Date:  05/30/2014   Anticipated DC Plan:  [facility]  CM consult  Outpatient Services - Pt will follow up      Choice offered to / List presented to:             Status of service:  Completed, signed off Medicare Important Message given?   (If response is "NO", the following Medicare IM given date fields will be blank) Date Medicare IM given:   Medicare IM given by:   Date Additional Medicare IM given:   Additional Medicare IM given by:    Discharge Disposition:  HOME/SELF CARE  Per UR Regulation:  Reviewed for med. necessity/level of care/duration of stay  If discussed at [place] of Stay Meetings, dates discussed:    Comments:  05/31/14- 1500- Marvetta Gibbons RN, BSN [phone] post discharge referral for outpt PT- [facility] for [facility]pt PT- they will [name]/u with pt regarding referral for outpt PT-

## 2014-06-02 NOTE — Discharge Summary (Addendum)
Discharge Summary  [name] GQB:[id number] DOB: 1941-02-12  PCP: Philis Fendt, MD  Admit date: 05/28/2014 Discharge date: 05/30/2014  Time spent: 25 minutes  Recommendations for Outpatient Follow-up:  1. Patient will follow-up with her primary care doc and if her knee is still giving her issues, referral for orthopedist is recommended  2. Patient referred for outpatient physical therapy 3. Patient given rolling walker  Discharge Diagnoses:  Active Hospital Problems   Diagnosis Date Noted  . HCAP (healthcare-associated pneumonia) 05/29/2014  . Tachycardia 05/29/2014  . Hypertension 05/29/2014  . History of high cholesterol 05/29/2014  . Right knee pain 05/29/2014    Resolved Hospital Problems   Diagnosis Date Noted Date Resolved  No resolved problems to display.    Discharge Condition: Improved, being discharged home  Diet recommendation: Low-sodium  Filed Weights   05/29/14 0359  Weight: 58.968 kg (130 lb)    History of present illness:  Patient is a 74 year old female admitted for right knee pain along with cough and shortness of breath. Ultrasound of the right lower extremity was negative for clot and CT angiogram of chest was performed negative for thrombus however patient was noted to have pneumonia. She had been recently hospitalized and treated for healthcare associated pneumonia.  Hospital Course:  Principal Problem:   HCAP (healthcare-associated pneumonia): By following day, patient feeling better. No shortness of breath. She'll be discharged in several more days of by mouth Levaquin complete a five-day course Active Problems:   Tachycardia: Secondary to shortness of breath and pain, resolved   Hypertension: Blood pressure stable   History of high cholesterol   Right knee pain: In examination, patient had mild anterior drawer sign. X-rays of knee unremarkable for fracture. Evaluated by physical therapy and patient set up with outpatient physical  therapy plus rolling walker   Procedures:  None  Consultations:  None  Discharge Exam: BP 121/52 mmHg  Pulse 93  Temp(Src) 99.1 F (37.3 C) (Oral)  Resp 18  Ht 5\' 3"  (1.6 m)  Wt 58.968 kg (130 lb)  BMI 23.03 kg/m2  SpO2 95%  General: Alert and oriented 3, no acute distress Cardiovascular: Regular rate and rhythm, S1-S2 Respiratory: Clear to auscultation bilaterally  Discharge Instructions You were cared for by a hospitalist during your hospital stay. If you have any questions about your discharge medications or the care you received while you were in the hospital after you are discharged, you can call the unit and asked to speak with the hospitalist on call if the hospitalist that took care of you is not available. Once you are discharged, your primary care physician will handle any further medical issues. Please note that NO REFILLS for any discharge medications will be authorized once you are discharged, as it is imperative that you return to your primary care physician (or establish a relationship with a primary care physician if you do not have one) for your aftercare needs so that they can reassess your need for medications and monitor your lab values.  Discharge Instructions    Diet - low sodium heart healthy    Complete by:  As directed      Increase activity slowly    Complete by:  As directed             Medication List    TAKE these medications        amLODipine 5 MG tablet  Commonly known as:  NORVASC  Take 5 mg by mouth daily.  aspirin EC 81 MG tablet  Take 81 mg by mouth daily.     levofloxacin 750 MG tablet  Commonly known as:  LEVAQUIN  Take 1 tablet (750 mg total) by mouth daily.     levothyroxine 100 MCG tablet  Commonly known as:  SYNTHROID, LEVOTHROID  Take 1 tablet (100 mcg total) by mouth daily before breakfast.     metoprolol succinate 100 MG 24 hr tablet  Commonly known as:  TOPROL-XL  Take 100 mg by mouth daily. Take with or  immediately following a meal.     oxyCODONE-acetaminophen 5-325 MG per tablet  Commonly known as:  PERCOCET/ROXICET  Take 1-2 tablets by mouth every 4 (four) hours as needed for moderate pain.     rosuvastatin 20 MG tablet  Commonly known as:  CRESTOR  Take 20 mg by mouth every evening.       No Known Allergies    The results of significant diagnostics from this hospitalization (including imaging, microbiology, ancillary and laboratory) are listed below for reference.    Significant Diagnostic Studies: Dg Chest 2 View  05/28/2014   CLINICAL DATA:  Acute tachycardia.  EXAM: CHEST  2 VIEW  COMPARISON:  05/25/2014 and prior radiographs dating back to 2005  FINDINGS: The cardiomediastinal silhouette is unremarkable.  There is no evidence of focal airspace disease, pulmonary edema, suspicious pulmonary nodule/mass, pleural effusion, or pneumothorax. No acute bony abnormalities are identified.  IMPRESSION: No active cardiopulmonary disease.   Electronically Signed   By: Margarette Canada M.D.   On: 05/28/2014 22:04   Dg Chest 2 View  05/25/2014   CLINICAL DATA:  Dyspnea.  New onset cough with fever.  EXAM: CHEST  2 VIEW  COMPARISON:  02/06/2005  FINDINGS: The heart size and mediastinal contours are within normal limits. Both lungs are clear. The visualized skeletal structures are unremarkable.  IMPRESSION: No active cardiopulmonary disease.   Electronically Signed   By: Andreas Newport M.D.   On: 05/25/2014 06:53   Dg Knee 1-2 Views Right  05/29/2014   CLINICAL DATA:  Knee instability  EXAM: RIGHT KNEE - 1-2 VIEW  COMPARISON:  None.  FINDINGS: No fracture of the proximal tibia or distal femur. Narrowing of the medial joint space. Patella is normal. No joint effusion.  IMPRESSION: Medial joint space narrowing.  No acute findings.   Electronically Signed   By: Suzy Bouchard M.D.   On: 05/29/2014 16:20   Ct Angio Chest Pe W/cm &/or Wo Cm  05/29/2014   CLINICAL DATA:  Recent thyroidectomy, leg  swelling.  EXAM: CT ANGIOGRAPHY CHEST WITH CONTRAST  TECHNIQUE: Multidetector CT imaging of the chest was performed using the standard protocol during bolus administration of intravenous contrast. Multiplanar CT image reconstructions and MIPs were obtained to evaluate the vascular anatomy.  CONTRAST:  80mL OMNIPAQUE IOHEXOL 350 MG/ML SOLN  COMPARISON:  05/28/2014 chest radiograph, 12/19/2004 CT.  FINDINGS: Mixed attenuation fluid and locules of air within the thyroid bed.  Suboptimal contrast bolus timing. No pulmonary embolism identified. Some of the peripheral (distal segmental and subsegmental) branches are poorly assessed due to contrast bolus timing and respiratory motion.  Normal caliber thoracic aorta and branch vessels with scattered atherosclerotic disease.  Normal heart size. Trace pericardial fluid. No pleural effusion. No intrathoracic lymphadenopathy.  Upper abdominal images show nothing acute.  Biapical scarring. Central airways are patent. Left lower lobe peripheral bronchi appear mildly thick walled though less distended than on the prior. Some of the peripheral bronchioles may be  partially impacted. No pneumothorax. Mild dependent atelectasis.  No acute osseous finding.  Review of the MIP images confirms the above findings.  IMPRESSION: No pulmonary embolism identified. Some of the peripheral vessels are poorly assessed due to contrast bolus timing and respiratory motion.  Postoperative changes of thyroidectomy with mixed attenuation fluid and locules of air in the operative bed. Cannot exclude infection in this setting.  Decreased left lower lobe opacification/peribronchial thickening and impaction.   Electronically Signed   By: Carlos Levering M.D.   On: 05/29/2014 01:58    Microbiology: No results found for this or any previous visit (from the past 240 hour(s)).   Labs: Basic Metabolic Panel:  Recent Labs Lab 05/28/14 1728 05/29/14 0525  NA 137 135  K 4.4 3.9  CL 100 99  CO2 28  29  GLUCOSE 158* 120*  BUN 10 7  CREATININE 0.60 0.74  CALCIUM 8.7 9.0   Liver Function Tests: No results for input(s): AST, ALT, ALKPHOS, BILITOT, PROT, ALBUMIN in the last 168 hours. No results for input(s): LIPASE, AMYLASE in the last 168 hours. No results for input(s): AMMONIA in the last 168 hours. CBC:  Recent Labs Lab 05/28/14 1728 05/29/14 0525  WBC 8.4 7.8  NEUTROABS 6.9  --   HGB 10.6* 11.0*  HCT 33.8* 34.3*  MCV 81.1 81.1  PLT 157 180   Cardiac Enzymes: No results for input(s): CKTOTAL, CKMB, CKMBINDEX, TROPONINI in the last 168 hours. BNP: BNP (last 3 results) No results for input(s): BNP in the last 8760 hours.  ProBNP (last 3 results) No results for input(s): PROBNP in the last 8760 hours.  CBG: No results for input(s): GLUCAP in the last 168 hours.     Signed:  Annita Brod  [facility] 06/02/2014, 9:39 AM

## 2014-06-10 ENCOUNTER — Encounter (HOSPITAL_COMMUNITY): Payer: Self-pay

## 2014-06-24 ENCOUNTER — Other Ambulatory Visit (HOSPITAL_COMMUNITY): Payer: Self-pay | Admitting: Internal Medicine

## 2014-06-24 DIAGNOSIS — C73 Malignant neoplasm of thyroid gland: Secondary | ICD-10-CM

## 2014-07-12 ENCOUNTER — Encounter (HOSPITAL_COMMUNITY)
Admission: RE | Admit: 2014-07-12 | Discharge: 2014-07-12 | Disposition: A | Discharge status: Home / Self Care | Payer: Managed Care, Other (non HMO) | Source: Ambulatory Visit | Attending: Internal Medicine | Admitting: Internal Medicine

## 2014-07-12 DIAGNOSIS — C73 Malignant neoplasm of thyroid gland: Secondary | ICD-10-CM | POA: Insufficient documentation

## 2014-07-12 MED ORDER — THYROTROPIN ALFA 1.1 MG IM SOLR
0.9000 mg | INTRAMUSCULAR | Status: AC
  Administered 2014-07-12: 0.9 mg via INTRAMUSCULAR

## 2014-07-13 ENCOUNTER — Encounter (HOSPITAL_COMMUNITY)
Admission: RE | Admit: 2014-07-13 | Discharge: 2014-07-13 | Disposition: A | Discharge status: Home / Self Care | Payer: Managed Care, Other (non HMO) | Source: Ambulatory Visit | Attending: Internal Medicine | Admitting: Internal Medicine

## 2014-07-13 DIAGNOSIS — C73 Malignant neoplasm of thyroid gland: Secondary | ICD-10-CM | POA: Diagnosis present

## 2014-07-13 MED ORDER — THYROTROPIN ALFA 1.1 MG IM SOLR
0.9000 mg | INTRAMUSCULAR | Status: AC
  Administered 2014-07-13: 0.9 mg via INTRAMUSCULAR

## 2014-07-14 ENCOUNTER — Encounter (HOSPITAL_COMMUNITY)
Admission: RE | Admit: 2014-07-14 | Discharge: 2014-07-14 | Disposition: A | Discharge status: Home / Self Care | Payer: Managed Care, Other (non HMO) | Source: Ambulatory Visit | Attending: Internal Medicine | Admitting: Internal Medicine

## 2014-07-14 DIAGNOSIS — C73 Malignant neoplasm of thyroid gland: Secondary | ICD-10-CM | POA: Insufficient documentation

## 2014-07-14 MED ORDER — SODIUM IODIDE I 131 CAPSULE
126.5000 | Freq: Once | INTRAVENOUS | Status: AC | PRN
  Administered 2014-07-14: 126.5 via ORAL

## 2014-07-23 ENCOUNTER — Encounter (HOSPITAL_COMMUNITY)
Admission: RE | Admit: 2014-07-23 | Discharge: 2014-07-23 | Disposition: A | Discharge status: Home / Self Care | Payer: Managed Care, Other (non HMO) | Source: Ambulatory Visit | Attending: Internal Medicine | Admitting: Internal Medicine

## 2014-07-23 DIAGNOSIS — C73 Malignant neoplasm of thyroid gland: Secondary | ICD-10-CM | POA: Diagnosis not present

## 2015-06-16 ENCOUNTER — Other Ambulatory Visit: Payer: Self-pay | Admitting: Internal Medicine

## 2015-06-16 DIAGNOSIS — E2839 Other primary ovarian failure: Secondary | ICD-10-CM

## 2015-07-06 ENCOUNTER — Ambulatory Visit
Admission: RE | Admit: 2015-07-06 | Discharge: 2015-07-06 | Disposition: A | Discharge status: Home / Self Care | Payer: Managed Care, Other (non HMO) | Source: Ambulatory Visit | Attending: Internal Medicine | Admitting: Internal Medicine

## 2015-07-06 DIAGNOSIS — E2839 Other primary ovarian failure: Secondary | ICD-10-CM

## 2015-09-08 ENCOUNTER — Other Ambulatory Visit: Payer: Self-pay | Admitting: Internal Medicine

## 2015-09-08 DIAGNOSIS — C73 Malignant neoplasm of thyroid gland: Secondary | ICD-10-CM

## 2015-09-12 ENCOUNTER — Ambulatory Visit
Admission: RE | Admit: 2015-09-12 | Discharge: 2015-09-12 | Disposition: A | Discharge status: Home / Self Care | Payer: Managed Care, Other (non HMO) | Source: Ambulatory Visit | Attending: Internal Medicine | Admitting: Internal Medicine

## 2015-09-12 ENCOUNTER — Other Ambulatory Visit: Payer: Managed Care, Other (non HMO)

## 2015-09-12 DIAGNOSIS — C73 Malignant neoplasm of thyroid gland: Secondary | ICD-10-CM

## 2016-09-05 DIAGNOSIS — C73 Malignant neoplasm of thyroid gland: Secondary | ICD-10-CM | POA: Diagnosis not present

## 2016-09-07 ENCOUNTER — Other Ambulatory Visit: Payer: Self-pay | Admitting: Internal Medicine

## 2016-09-07 DIAGNOSIS — C73 Malignant neoplasm of thyroid gland: Secondary | ICD-10-CM

## 2016-09-18 ENCOUNTER — Ambulatory Visit
Admission: RE | Admit: 2016-09-18 | Discharge: 2016-09-18 | Disposition: A | Discharge status: Home / Self Care | Payer: BLUE CROSS/BLUE SHIELD | Source: Ambulatory Visit | Attending: Internal Medicine | Admitting: Internal Medicine

## 2016-09-18 DIAGNOSIS — C73 Malignant neoplasm of thyroid gland: Secondary | ICD-10-CM | POA: Diagnosis not present

## 2017-09-13 DIAGNOSIS — C73 Malignant neoplasm of thyroid gland: Secondary | ICD-10-CM | POA: Diagnosis not present

## 2017-09-19 DIAGNOSIS — C73 Malignant neoplasm of thyroid gland: Secondary | ICD-10-CM | POA: Diagnosis not present

## 2017-09-19 DIAGNOSIS — E890 Postprocedural hypothyroidism: Secondary | ICD-10-CM | POA: Diagnosis not present

## 2017-09-19 DIAGNOSIS — R634 Abnormal weight loss: Secondary | ICD-10-CM | POA: Diagnosis not present

## 2017-11-20 DIAGNOSIS — R634 Abnormal weight loss: Secondary | ICD-10-CM | POA: Diagnosis not present

## 2017-11-20 DIAGNOSIS — E890 Postprocedural hypothyroidism: Secondary | ICD-10-CM | POA: Diagnosis not present

## 2017-11-20 DIAGNOSIS — C73 Malignant neoplasm of thyroid gland: Secondary | ICD-10-CM | POA: Diagnosis not present

## 2018-01-06 DIAGNOSIS — E890 Postprocedural hypothyroidism: Secondary | ICD-10-CM | POA: Diagnosis not present

## 2018-03-21 IMAGING — US US SOFT TISSUE HEAD/NECK
1 series · 14 of 14 positions shown · non-contrast
Comparison: 11/05/2013

CLINICAL DATA: Thyroid cancer

EXAM:
THYROID ULTRASOUND
TECHNIQUE: Ultrasound examination of the thyroid gland and adjacent soft
tissues was performed.

[Series 1: us soft tissue head/neck · 0.08mm/px · 14 of 14 slices shown]
[im 1/14]
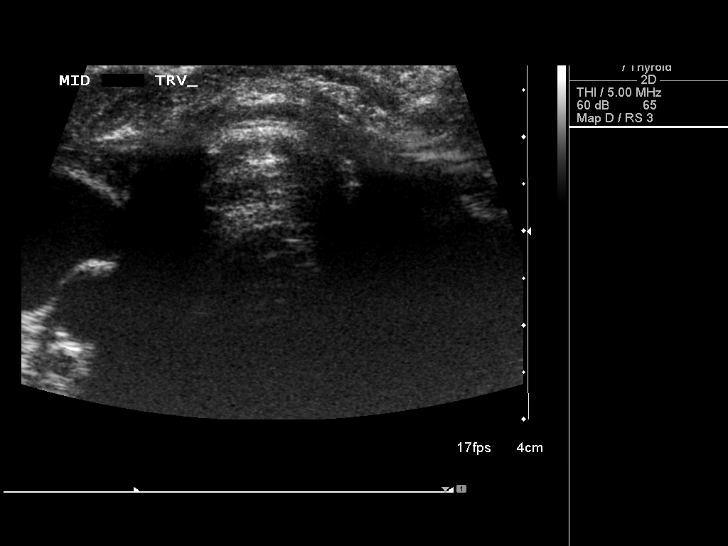
[im 2/14]
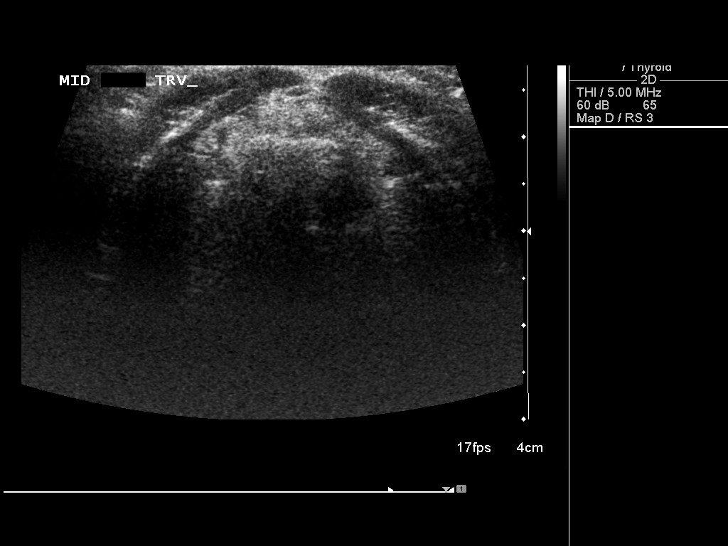
[im 3/14]
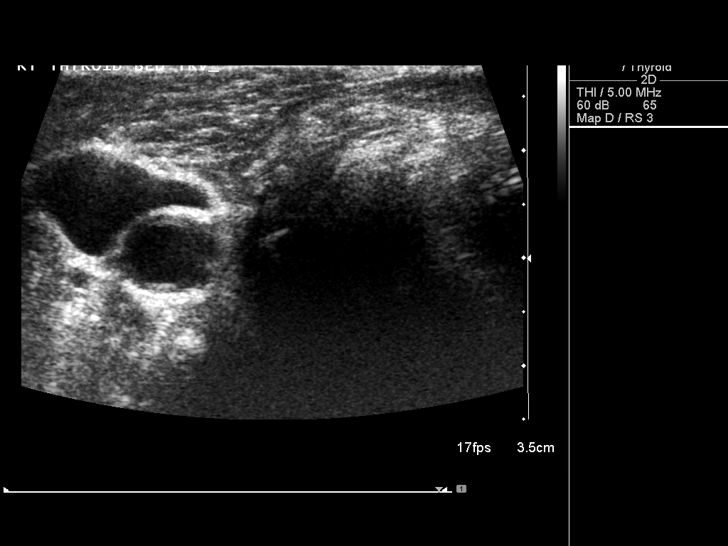
[im 4/14]
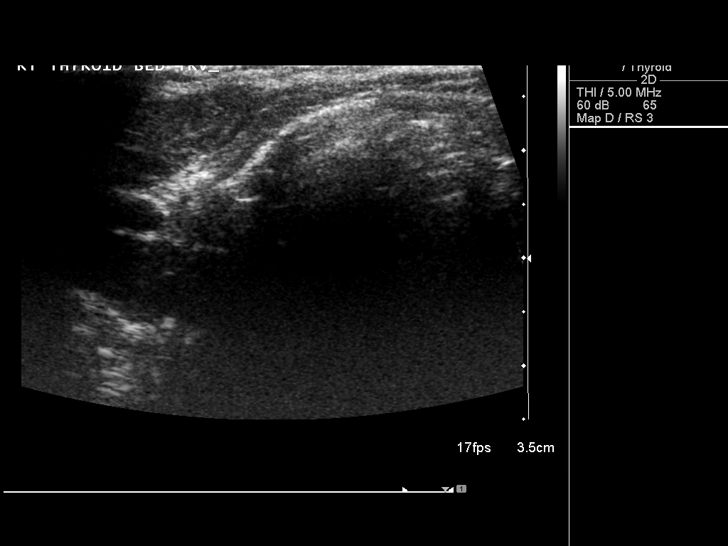
[im 5/14]
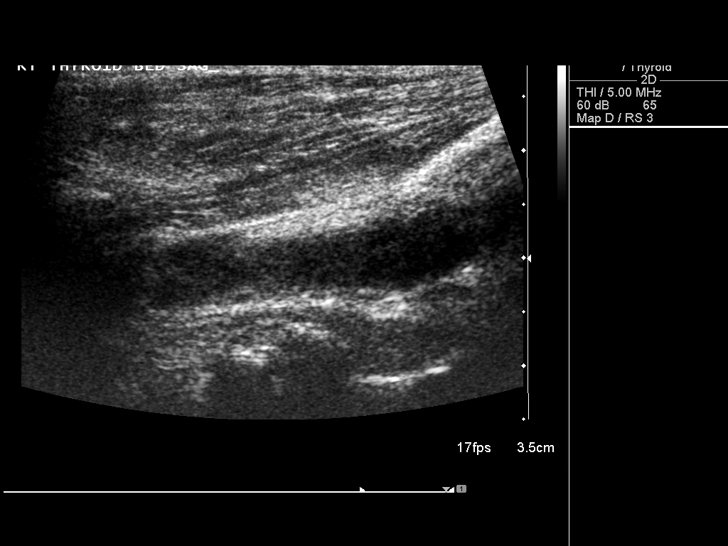
[im 6/14]
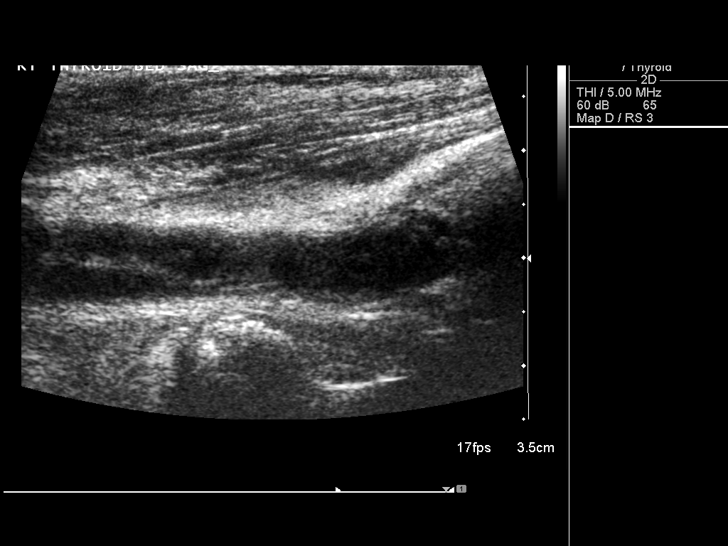
[im 7/14]
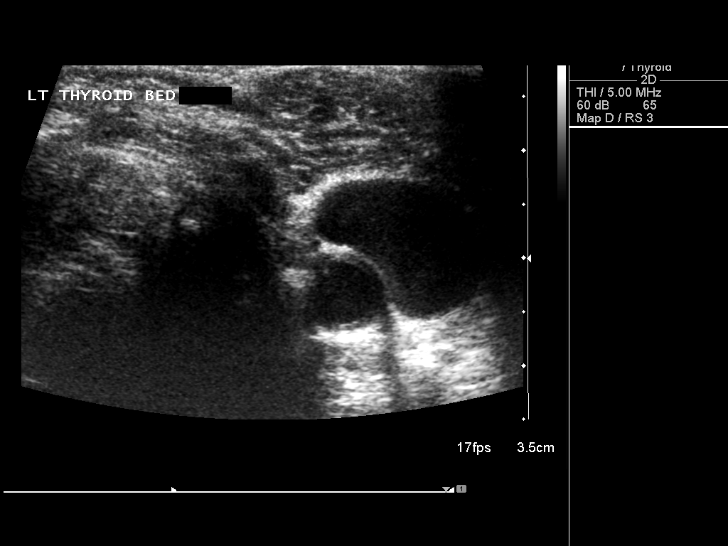
[im 8/14]
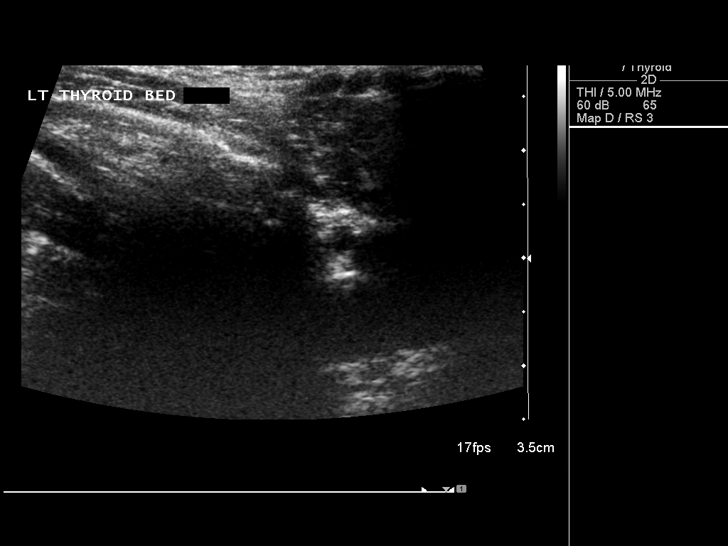
[im 9/14]
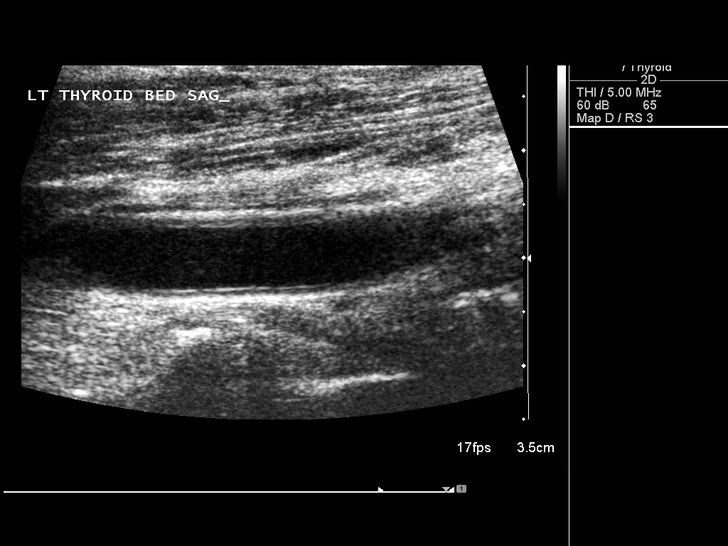
[im 10/14]
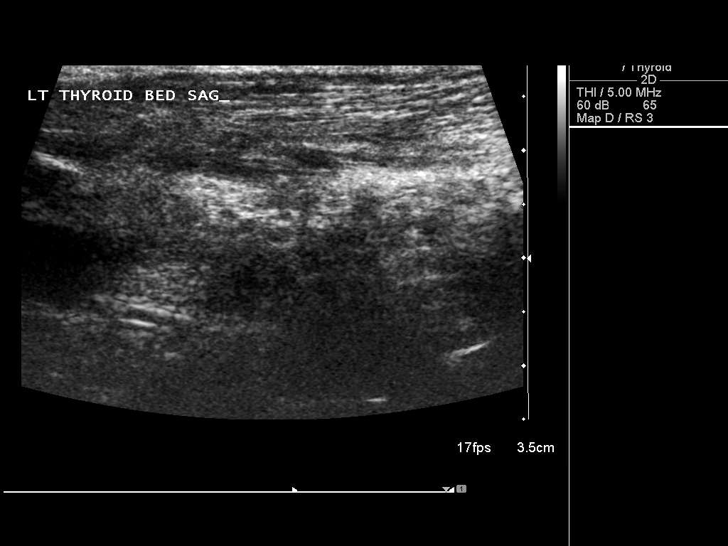
[im 11/14]
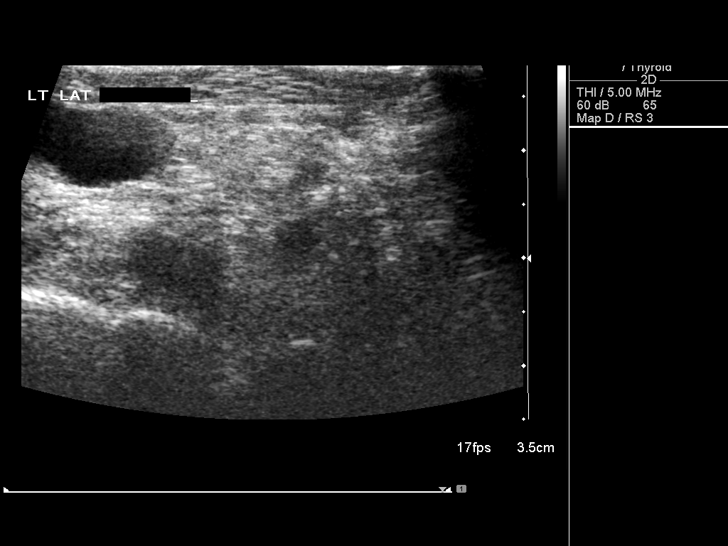
[im 12/14]
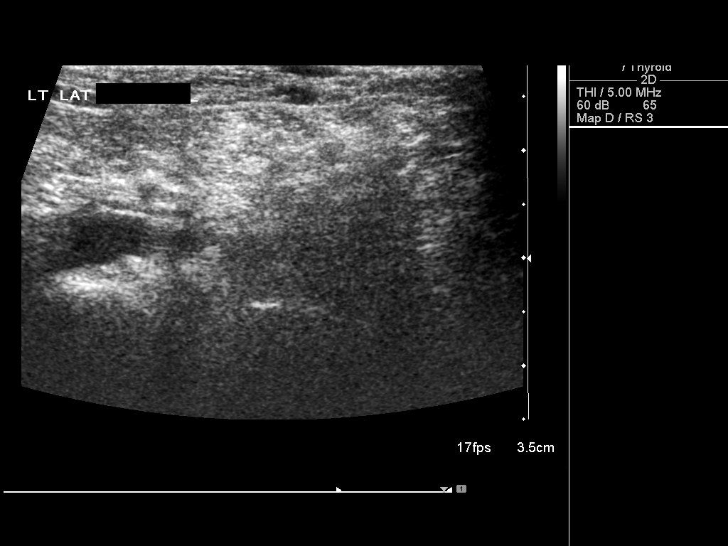
[im 13/14]
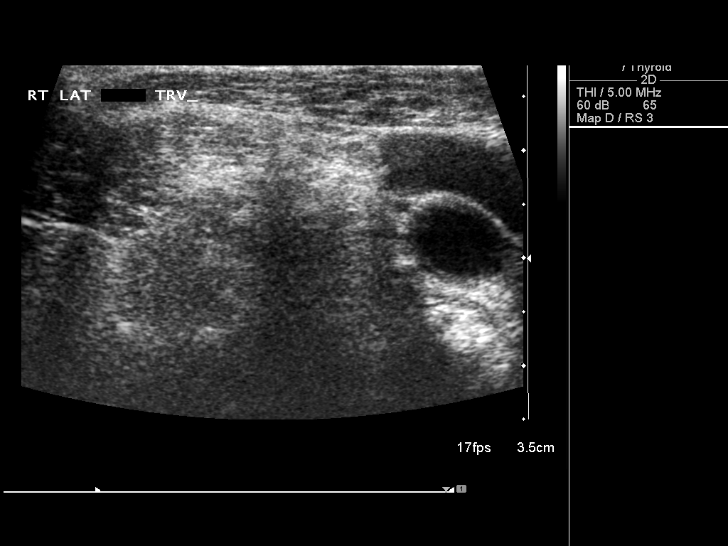
[im 14/14]
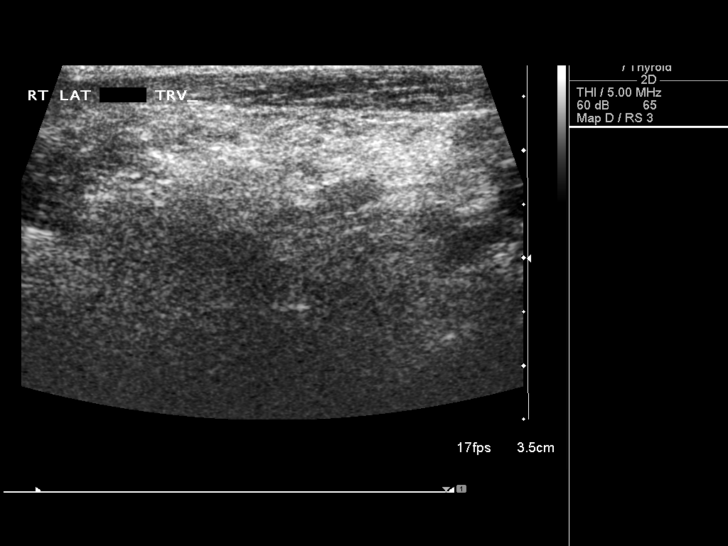

[14 of 14 positions shown; findings below may reference images not displayed]

FINDINGS: Right thyroid lobe

No abnormal tissue.

Left thyroid lobe

No abnormal tissue.

Isthmus

No abnormal tissue.

Lymphadenopathy

None visualized.
IMPRESSION: No abnormal tissue in the thyroid bed to suggest residual or
recurrent thyroid malignancy.

## 2018-10-02 ENCOUNTER — Encounter (HOSPITAL_COMMUNITY): Payer: Self-pay | Admitting: Emergency Medicine

## 2018-10-02 ENCOUNTER — Emergency Department (HOSPITAL_COMMUNITY): Payer: BC Managed Care – PPO

## 2018-10-02 ENCOUNTER — Inpatient Hospital Stay (HOSPITAL_COMMUNITY)
Admission: EM | Admit: 2018-10-02 | Discharge: 2018-10-04 | DRG: 177 | Disposition: A | Discharge status: Home / Self Care | Payer: BC Managed Care – PPO | Attending: Internal Medicine | Admitting: Internal Medicine

## 2018-10-02 ENCOUNTER — Other Ambulatory Visit: Payer: Self-pay

## 2018-10-02 DIAGNOSIS — E890 Postprocedural hypothyroidism: Secondary | ICD-10-CM | POA: Diagnosis present

## 2018-10-02 DIAGNOSIS — E869 Volume depletion, unspecified: Secondary | ICD-10-CM | POA: Diagnosis not present

## 2018-10-02 DIAGNOSIS — Z8639 Personal history of other endocrine, nutritional and metabolic disease: Secondary | ICD-10-CM

## 2018-10-02 DIAGNOSIS — Z79890 Hormone replacement therapy (postmenopausal): Secondary | ICD-10-CM | POA: Diagnosis not present

## 2018-10-02 DIAGNOSIS — J1289 Other viral pneumonia: Secondary | ICD-10-CM | POA: Diagnosis present

## 2018-10-02 DIAGNOSIS — E785 Hyperlipidemia, unspecified: Secondary | ICD-10-CM | POA: Diagnosis present

## 2018-10-02 DIAGNOSIS — Z79899 Other long term (current) drug therapy: Secondary | ICD-10-CM | POA: Diagnosis not present

## 2018-10-02 DIAGNOSIS — Z90710 Acquired absence of both cervix and uterus: Secondary | ICD-10-CM | POA: Diagnosis not present

## 2018-10-02 DIAGNOSIS — E041 Nontoxic single thyroid nodule: Secondary | ICD-10-CM | POA: Diagnosis present

## 2018-10-02 DIAGNOSIS — I10 Essential (primary) hypertension: Secondary | ICD-10-CM | POA: Diagnosis present

## 2018-10-02 DIAGNOSIS — Z7982 Long term (current) use of aspirin: Secondary | ICD-10-CM

## 2018-10-02 DIAGNOSIS — J1282 Pneumonia due to coronavirus disease 2019: Secondary | ICD-10-CM | POA: Diagnosis present

## 2018-10-02 DIAGNOSIS — R0602 Shortness of breath: Secondary | ICD-10-CM | POA: Diagnosis present

## 2018-10-02 DIAGNOSIS — N179 Acute kidney failure, unspecified: Secondary | ICD-10-CM | POA: Diagnosis present

## 2018-10-02 DIAGNOSIS — E860 Dehydration: Secondary | ICD-10-CM | POA: Diagnosis present

## 2018-10-02 DIAGNOSIS — J189 Pneumonia, unspecified organism: Secondary | ICD-10-CM

## 2018-10-02 DIAGNOSIS — U071 COVID-19: Principal | ICD-10-CM | POA: Diagnosis present

## 2018-10-02 DIAGNOSIS — J181 Lobar pneumonia, unspecified organism: Secondary | ICD-10-CM | POA: Diagnosis not present

## 2018-10-02 DIAGNOSIS — E7800 Pure hypercholesterolemia, unspecified: Secondary | ICD-10-CM | POA: Diagnosis present

## 2018-10-02 DIAGNOSIS — J9601 Acute respiratory failure with hypoxia: Secondary | ICD-10-CM | POA: Diagnosis present

## 2018-10-02 LAB — BASIC METABOLIC PANEL
Anion gap: 14 (ref 5–15)
BUN: 36 mg/dL — ABNORMAL HIGH (ref 8–23)
CO2: 24 mmol/L (ref 22–32)
Calcium: 9 mg/dL (ref 8.9–10.3)
Chloride: 102 mmol/L (ref 98–111)
Creatinine, Ser: 1.09 mg/dL — ABNORMAL HIGH (ref 0.44–1.00)
GFR calc Af Amer: 56 mL/min — ABNORMAL LOW (ref >60)
GFR calc non Af Amer: 48 mL/min — ABNORMAL LOW (ref >60)
Glucose, Bld: 87 mg/dL (ref 70–99)
Potassium: 3.7 mmol/L (ref 3.5–5.1)
Sodium: 140 mmol/L (ref 135–145)

## 2018-10-02 LAB — URINALYSIS, ROUTINE W REFLEX MICROSCOPIC
Bilirubin Urine: NEGATIVE
Glucose, UA: NEGATIVE mg/dL
Ketones, ur: 5 mg/dL — AB
Leukocytes,Ua: NEGATIVE
Nitrite: NEGATIVE
Protein, ur: NEGATIVE mg/dL
Specific Gravity, Urine: 1.016 (ref 1.005–1.030)
pH: 5 (ref 5.0–8.0)

## 2018-10-02 LAB — CBC WITH DIFFERENTIAL/PLATELET
Abs Immature Granulocytes: 0.02 10*3/uL (ref 0.00–0.07)
Basophils Absolute: 0 10*3/uL (ref 0.0–0.1)
Basophils Relative: 0 %
Eosinophils Absolute: 0 10*3/uL (ref 0.0–0.5)
Eosinophils Relative: 0 %
HCT: 47.2 % — ABNORMAL HIGH (ref 36.0–46.0)
Hemoglobin: 15.3 g/dL — ABNORMAL HIGH (ref 12.0–15.0)
Immature Granulocytes: 0 %
Lymphocytes Relative: 11 %
Lymphs Abs: 0.5 10*3/uL — ABNORMAL LOW (ref 0.7–4.0)
MCH: 26.8 pg (ref 26.0–34.0)
MCHC: 32.4 g/dL (ref 30.0–36.0)
MCV: 82.7 fL (ref 80.0–100.0)
Monocytes Absolute: 0.5 10*3/uL (ref 0.1–1.0)
Monocytes Relative: 10 %
Neutro Abs: 3.7 10*3/uL (ref 1.7–7.7)
Neutrophils Relative %: 79 %
Platelets: UNDETERMINED 10*3/uL (ref 150–400)
RBC: 5.71 MIL/uL — ABNORMAL HIGH (ref 3.87–5.11)
RDW: 13.9 % (ref 11.5–15.5)
WBC: 4.7 10*3/uL (ref 4.0–10.5)
nRBC: 0 % (ref 0.0–0.2)

## 2018-10-02 LAB — SARS CORONAVIRUS 2 BY RT PCR (HOSPITAL ORDER, PERFORMED IN ~~LOC~~ HOSPITAL LAB): SARS Coronavirus 2: POSITIVE — AB

## 2018-10-02 LAB — TROPONIN I (HIGH SENSITIVITY): Troponin I (High Sensitivity): 11 ng/L (ref <18)

## 2018-10-02 LAB — TSH: TSH: 0.528 u[IU]/mL (ref 0.350–4.500)

## 2018-10-02 MED ORDER — CEFTRIAXONE SODIUM 1 G IJ SOLR
1.0000 g | Freq: Once | INTRAMUSCULAR | Status: AC
  Administered 2018-10-02: 1 g via INTRAVENOUS
  Filled 2018-10-02: qty 10

## 2018-10-02 MED ORDER — SODIUM CHLORIDE 0.9 % IV SOLN
500.0000 mg | Freq: Once | INTRAVENOUS | Status: AC
  Administered 2018-10-02: 500 mg via INTRAVENOUS
  Filled 2018-10-02: qty 500

## 2018-10-02 MED ORDER — SODIUM CHLORIDE 0.9 % IV SOLN
INTRAVENOUS | Status: AC
  Administered 2018-10-02: 20:00:00 via INTRAVENOUS

## 2018-10-02 MED ORDER — SODIUM CHLORIDE 0.9 % IV BOLUS
1000.0000 mL | Freq: Once | INTRAVENOUS | Status: AC
  Administered 2018-10-02: 1000 mL via INTRAVENOUS

## 2018-10-02 NOTE — ED Notes (Signed)
Pt. is aware she needs to provide a urine specimen.

## 2018-10-02 NOTE — ED Notes (Addendum)
Pt called RN to room, arm w/ antibiotic (azithromycin) running was sore and slightly red.  Pump stopped, flush to verify good IV access.  No more pain  No respiratory distress or other symptoms noted at this time.  Admitting provider notified.  Will continue to monitor.

## 2018-10-02 NOTE — ED Notes (Signed)
Provider called back and notified of reaction.  Will continue to monitor

## 2018-10-02 NOTE — H&P (Signed)
History and Physical  [name] DJM:[id number] DOB: October 15, 1940 DOA: 10/02/2018  Referring physician: ER provider PCP: Nolene Ebbs, MD  Outpatient Specialists:    Patient coming from: Home  Chief Complaint: Shortness of breath/fever  HPI: Patient is a 78 year old African-American female with past medical history significant for hypertension and hyperlipidemia.  Patient's problem started about 3 days ago with low-grade fever and associated shortness of breath.  No history of productive cough, no headache, no loss of smell or taste, no sore throat or URI symptoms.  On presentation to the hospital, COVID test came back positive.  Chest x-ray is also said to reveal left lower lobe infiltrate.  Patient's husband is also currently in the emergency room, and may have also tested positive for COVID.  Patient's O2 saturation is greater than 94% on room air, but tends to drop with ambulation.  Patient is currently on 2 L of supplemental oxygen with O2 sat of 99%.  No neck pain, no chest pain, no GI symptoms and no urinary symptoms.  Patient be admitted for further assessment and management.  ED Course: On presentation to the ER, pressure was 99.6, heart rate range from 96 to 103 bpm, respiratory rate has ranged from 16 to 29/min, blood pressure 156/75 O2 sat of 99% on 2 L.  As mentioned above, ankle with test came back positive.  Chest x-ray reveals left lower lobe infiltrate.  Patient has received IV Rocephin and azithromycin in the ER.  Hospitalist team has been asked to admit patient for further assessment and management.  Pertinent labs: Chemistry reveals sodium of 140, potassium of 3.7, chloride 102, CO2 24, BUN of 36 with creatinine of 1.09.  BC reveals WBC of 4.7, hemoglobin of 15.4, hematocrit of 47.2, MCV of 82.7, the platelet is clumped, therefore, no reported.  Specific gravity of 1.016, with moderate hemoglobin and 0-5 RBC.  EKG: Independently reviewed.   Imaging: independently  reviewed.   Review of Systems: Negative for visual changes, sore throat, rash, new muscle aches, chest pain, dysuria, bleeding, n/v/abdominal pain.  Past Medical History:  Diagnosis Date  . History of high cholesterol   . Hypertension     Past Surgical History:  Procedure Laterality Date  . ABDOMINAL HYSTERECTOMY    . BACK SURGERY      X 2  "BEEN A WHILE"  . BREAST SURGERY     REMOVAL OF CYST FROM RIGHT BREAST  . THYROIDECTOMY N/A 05/25/2014   Procedure: COMPLETION THYROIDECTOMY;  Surgeon: [name], MD;  Location: [facility];  Service: General;  Laterality: N/A;  . THYROIDECTOMY, PARTIAL       reports that she has never smoked. She does not have any smokeless tobacco history on file. She reports that she does not drink alcohol or use drugs.  No Known Allergies  No family history on file.   Prior to Admission medications   Medication Sig Start Date End Date Taking? Authorizing Provider  amLODipine (NORVASC) 5 MG tablet Take 5 mg by mouth daily.   Yes [provider]  aspirin EC 81 MG tablet Take 81 mg by mouth daily.   Yes [provider]  DM-Phenylephrine-Acetaminophen (VICKS DAYQUIL COLD & FLU) 10-5-325 MG/15ML LIQD Take 15 mLs by mouth every 12 (twelve) hours as needed (cold symptoms).   Yes [provider]  levothyroxine (SYNTHROID, LEVOTHROID) 100 MCG tablet Take 1 tablet (100 mcg total) by mouth daily before breakfast. 05/26/14  Yes Cornett, [name], MD  metoprolol succinate (TOPROL-XL) 100 MG 24  hr tablet Take 100 mg by mouth daily. Take with or immediately following a meal.   Yes [provider]  Multiple Vitamin (MULTIVITAMIN) tablet Take 1 tablet by mouth daily.   Yes [provider]  rosuvastatin (CRESTOR) 20 MG tablet Take 20 mg by mouth every evening.    Yes [provider]  oxyCODONE-acetaminophen (PERCOCET/ROXICET) 5-325 MG per tablet Take 1-2 tablets by mouth every 4 (four) hours as needed for moderate pain.  Patient not taking: Reported on 10/02/2018 05/26/14   [name], MD    Physical Exam: Vitals:   10/02/18 1800 10/02/18 1815 10/02/18 1830 10/02/18 1845  BP: 132/81 139/70 (!) 145/82 139/73  Pulse: 100 (!) 101 98 (!) 102  Resp: (!) 24 (!) 23 (!) 25 16  Temp:      TempSrc:      SpO2: 95% 96% 97% 98%  Weight:      Height:         Constitutional:  . Appears calm and comfortable Eyes:  . No pallor. No jaundice.  ENMT:  . external ears, nose appear normal Neck:  . Neck is supple. No JVD Respiratory:  . Decreased air entry. Cardiovascular:  . S1S2 . No LE extremity edema   Abdomen:  . Abdomen is soft and non tender. Organs are difficult to assess. Neurologic:  . Awake and alert. . Moves all limbs.  Wt Readings from Last 3 Encounters:  10/02/18 59 kg  05/29/14 59 kg  05/25/14 62.6 kg    I have personally reviewed following labs and imaging studies  Labs on Admission:  CBC: Recent Labs  Lab 10/02/18 1534  WBC 4.7  NEUTROABS 3.7  HGB 15.3*  HCT 47.2*  MCV 82.7  PLT PLATELET CLUMPS NOTED ON SMEAR, UNABLE TO ESTIMATE   Basic Metabolic Panel: Recent Labs  Lab 10/02/18 1534  NA 140  K 3.7  CL 102  CO2 24  GLUCOSE 87  BUN 36*  CREATININE 1.09*  CALCIUM 9.0   Liver Function Tests: No results for input(s): AST, ALT, ALKPHOS, BILITOT, PROT, ALBUMIN in the last 168 hours. No results for input(s): LIPASE, AMYLASE in the last 168 hours. No results for input(s): AMMONIA in the last 168 hours. Coagulation Profile: No results for input(s): INR, PROTIME in the last 168 hours. Cardiac Enzymes: No results for input(s): CKTOTAL, CKMB, CKMBINDEX, TROPONINI in the last 168 hours. BNP (last 3 results) No results for input(s): PROBNP in the last 8760 hours. HbA1C: No results for input(s): HGBA1C in the last 72 hours. CBG: No results for input(s): GLUCAP in the last 168 hours. Lipid Profile: No results for input(s): CHOL, HDL, LDLCALC, TRIG, CHOLHDL, LDLDIRECT  in the last 72 hours. Thyroid Function Tests: Recent Labs    10/02/18 1640  TSH 0.528   Anemia Panel: No results for input(s): VITAMINB12, FOLATE, FERRITIN, TIBC, IRON, RETICCTPCT in the last 72 hours. Urine analysis:    Component Value Date/Time   COLORURINE YELLOW 10/02/2018 1534   APPEARANCEUR HAZY (A) 10/02/2018 1534   LABSPEC 1.016 10/02/2018 1534   PHURINE 5.0 10/02/2018 1534   GLUCOSEU NEGATIVE 10/02/2018 1534   HGBUR MODERATE (A) 10/02/2018 1534   BILIRUBINUR NEGATIVE 10/02/2018 1534   KETONESUR 5 (A) 10/02/2018 1534   PROTEINUR NEGATIVE 10/02/2018 1534   NITRITE NEGATIVE 10/02/2018 1534   LEUKOCYTESUR NEGATIVE 10/02/2018 1534   Sepsis Labs: @LABRCNTIP (procalcitonin:4,lacticidven:4) ) Recent Results (from the past 240 hour(s))  SARS Coronavirus 2 (CEPHEID- Performed in [facility] hospital lab), [facility]  Status: Abnormal   Collection Time: 10/02/18  3:57 PM   Specimen: Nasopharyngeal Swab  Result Value Ref Range Status   SARS Coronavirus 2 POSITIVE (A) NEGATIVE Final    Comment: RESULT CALLED TO, READ BACK BY AND VERIFIED WITH: H HARDY RN 10/02/18 1729 JDW (NOTE) If result is NEGATIVE SARS-CoV-2 target nucleic acids are NOT DETECTED. The SARS-CoV-2 RNA is generally detectable in upper and lower  respiratory specimens during the acute phase of infection. The lowest  concentration of SARS-CoV-2 viral copies this assay can detect is 250  copies / mL. A negative result does not preclude SARS-CoV-2 infection  and should not be used as the sole basis for treatment or other  patient management decisions.  A negative result may occur with  improper specimen collection / handling, submission of specimen other  than nasopharyngeal swab, presence of viral mutation(s) within the  areas targeted by this assay, and inadequate number of viral copies  (<250 copies / mL). A negative result must be combined with clinical  observations, patient history, and epidemiological  information. If result is POSITIVE SARS-CoV-2 target nucleic acids are DETECTED. The SARS- CoV-2 RNA is generally detectable in upper and lower  respiratory specimens during the acute phase of infection.  Positive  results are indicative of active infection with SARS-CoV-2.  Clinical  correlation with patient history and other diagnostic information is  necessary to determine patient infection status.  Positive results do  not rule out bacterial infection or co-infection with other viruses. If result is PRESUMPTIVE POSTIVE SARS-CoV-2 nucleic acids MAY BE PRESENT.   A presumptive positive result was obtained on the submitted specimen  and confirmed on repeat testing.  While 2019 novel coronavirus  (SARS-CoV-2) nucleic acids may be present in the submitted sample  additional confirmatory testing may be necessary for epidemiological  and / or clinical management purposes  to differentiate between  SARS-CoV-2 and other Sarbecovirus currently known to infect humans.  If clinically indicated additional testing with an alternate test  methodology [phone]) is advised . The SARS-CoV-2 RNA is generally  detectable in upper and lower respiratory specimens during the acute  phase of infection. The expected result is Negative. Fact Sheet for Patients:  StrictlyIdeas.no Fact Sheet for Healthcare Providers: BankingDealers.co.za This test is not yet approved or cleared by the [place] FDA and has been authorized for detection and/or diagnosis of SARS-CoV-2 by FDA under an Emergency Use Authorization (EUA).  This EUA will remain in effect (meaning this test can be used) for the duration of the COVID-19 declaration under Section 564(b)(1) of the Act, 21 U.S.C. section 360bbb-3(b)(1), unless the authorization is terminated or revoked sooner. Performed at [facility], [place] [address] [place], [place] [address]       Radiological Exams on  Admission: Dg Chest Port 1 View  Result Date: 10/02/2018 CLINICAL DATA:  Shortness of breath, fever EXAM: PORTABLE CHEST 1 VIEW COMPARISON:  05/28/2014 FINDINGS: Cardiac silhouette appears mildly enlarged. Aorta is calcified and tortuous. Patchy airspace opacity in the left lung base. Right lung appears clear. No pleural effusion or pneumothorax identified. Multiple surgical clips within the left neck. IMPRESSION: Patchy left lower lobe airspace opacity suspicious for pneumonia. Electronically Signed   By: Davina Poke M.D.   On: 10/02/2018 16:27    EKG: Independently reviewed.   Active Problems:   Pneumonia due to severe acute respiratory syndrome coronavirus 2 (SARS-CoV-2)   Assessment/Plan  Pneumonia due to severe acute respiratory syndrome coronavirus 2 (SARS-CoV-2):  Cannot rule out concurrent community-acquired pneumonia.   Admit patient to the hospital for further assessment and management. Precautions for COVID-19. Sputum culture Check inflammatory markers Start patient 10 days on oral dexamethasone, Rocephin and azithromycin Daily inflammatory markers Urine Legionella and strep pneumonia. Further management will depend on hospital course  Acute kidney injury: Possibly secondary to volume depletion Cannot rule out rhabdomyolysis (UA reveals moderate hemoglobin with only 0-5 RBC) Hydrate patient Check CPK For further work-up if no significant improvement is noted.  Volume depletion: Cautious hydration, considering positive COVID possible pneumonia.  Hypertension: Continue home medications. Continue to optimize.  Hyperlipidemia: Continue home medications.  DVT prophylaxis: Subacute Lovenox Code Status: Full code Family Communication:  Disposition Plan: This will depend on hospital course Consults called: None Admission status: Inpatient  Time spent: 65 minutes.  Dana Allan, MD  [facility] Pager #: [phone] 7PM-7AM contact night coverage  as above  10/02/2018, 7:46 PM

## 2018-10-02 NOTE — ED Provider Notes (Signed)
[facility] EMERGENCY DEPARTMENT Provider Note   CSN: [id number] Arrival date & time: 10/02/18  1431    History   Chief Complaint Chief Complaint  Patient presents with   Shortness of Breath    HPI [name] is a 78 y.o. female.     HPI  78 year old female patient with past medical history of hypertension, hypothyroidism, hyperlipidemia who presents to the emergency department today for evaluation of fatigue, weakness, productive cough and mild shortness of breath.  Patient arrives by EMS, temperature with EMS was 100.2.  Her symptoms began 4 days ago.  Oxygen saturation on room air 99%.  Past Medical History:  Diagnosis Date   History of high cholesterol    Hypertension     Patient Active Problem List   Diagnosis Date Noted   HCAP (healthcare-associated pneumonia) 05/29/2014   Tachycardia with 121 - 140 beats per minute 05/29/2014   Tachycardia 05/29/2014   Hypertension 05/29/2014   History of high cholesterol 05/29/2014   Right knee pain 05/29/2014   Thyroid nodule 05/25/2014    Past Surgical History:  Procedure Laterality Date   ABDOMINAL HYSTERECTOMY     BACK SURGERY      X 2  "BEEN A WHILE"   BREAST SURGERY     REMOVAL OF CYST FROM RIGHT BREAST   THYROIDECTOMY N/A 05/25/2014   Procedure: COMPLETION THYROIDECTOMY;  Surgeon: [name], MD;  Location: [facility];  Service: General;  Laterality: N/A;   THYROIDECTOMY, PARTIAL       OB History   No obstetric history on file.      Home Medications    Prior to Admission medications   Medication Sig Start Date End Date Taking? Authorizing Provider  amLODipine (NORVASC) 5 MG tablet Take 5 mg by mouth daily.   Yes [provider]  aspirin EC 81 MG tablet Take 81 mg by mouth daily.   Yes [provider]  DM-Phenylephrine-Acetaminophen (VICKS DAYQUIL COLD & FLU) 10-5-325 MG/15ML LIQD Take 15 mLs by mouth every 12 (twelve) hours as needed (cold symptoms).    Yes [provider]  levothyroxine (SYNTHROID, LEVOTHROID) 100 MCG tablet Take 1 tablet (100 mcg total) by mouth daily before breakfast. 05/26/14  Yes Cornett, [name], MD  metoprolol succinate (TOPROL-XL) 100 MG 24 hr tablet Take 100 mg by mouth daily. Take with or immediately following a meal.   Yes [provider]  Multiple Vitamin (MULTIVITAMIN) tablet Take 1 tablet by mouth daily.   Yes [provider]  rosuvastatin (CRESTOR) 20 MG tablet Take 20 mg by mouth every evening.    Yes [provider]  oxyCODONE-acetaminophen (PERCOCET/ROXICET) 5-325 MG per tablet Take 1-2 tablets by mouth every 4 (four) hours as needed for moderate pain. Patient not taking: Reported on 10/02/2018 05/26/14   [name], MD    Family History No family history on file.  Social History Social History   Tobacco Use   Smoking status: Never Smoker  Substance Use Topics   Alcohol use: No   Drug use: No     Allergies   Patient has no known allergies.   Review of Systems Review of Systems  Constitutional: Positive for activity change, appetite change (decreased over the last week) and fatigue. Negative for chills.  HENT: Positive for sore throat. Negative for ear pain and trouble swallowing.   Eyes: Negative for pain and visual disturbance.  Respiratory: Positive for cough (mild, productive of yellow sputum) and shortness of breath (primarily with  exertion). Negative for wheezing.   Cardiovascular: Negative for chest pain and palpitations.  Gastrointestinal: Negative for abdominal pain, nausea and vomiting.  Genitourinary: Positive for decreased urine volume (has not been eating or drinking as much as usual and urinating less). Negative for difficulty urinating, dysuria and hematuria.  Musculoskeletal: Negative for arthralgias and back pain.  Skin: Negative for color change and rash.  Neurological: Positive for weakness. Negative for seizures and syncope.  All  other systems reviewed and are negative.    Physical Exam Updated Vital Signs BP 138/75 (BP Location: Right Arm)    Pulse 98    Temp 99.6 F (37.6 C) (Oral)    Resp (!) 26    Ht 5\' 6"  (1.676 m)    Wt 59 kg    SpO2 98%    BMI 20.98 kg/m   Physical Exam Vitals signs and nursing note reviewed.  Constitutional:      General: She is not in acute distress.    Appearance: Normal appearance. She is underweight. She is not ill-appearing or toxic-appearing.  HENT:     Head: Normocephalic and atraumatic.     Right Ear: External ear normal.     Left Ear: External ear normal.     Nose: Nose normal. No congestion.     Mouth/Throat:     Mouth: Mucous membranes are moist.     Pharynx: Posterior oropharyngeal erythema (erythema of posterior oropharynx) present.  Eyes:     Conjunctiva/sclera: Conjunctivae normal.     Pupils: Pupils are equal, round, and reactive to light.  Neck:     Musculoskeletal: Normal range of motion and neck supple. No muscular tenderness.  Cardiovascular:     Rate and Rhythm: Regular rhythm. Tachycardia present.     Pulses: Normal pulses.     Heart sounds: No murmur.  Pulmonary:     Effort: Pulmonary effort is normal. No respiratory distress.     Comments: Diminished lung sounds throughout, though grossly clear on the right and mild rhonchi on the left mid lung and bases Abdominal:     General: Abdomen is flat.     Palpations: Abdomen is soft.     Tenderness: There is no abdominal tenderness.  Lymphadenopathy:     Cervical: No cervical adenopathy.  Skin:    General: Skin is warm and dry.     Capillary Refill: Capillary refill takes less than 2 seconds.  Neurological:     General: No focal deficit present.     Mental Status: She is alert.      ED Treatments / Results  Labs (all labs ordered are listed, but only abnormal results are displayed) Labs Reviewed  SARS CORONAVIRUS 2 ([facility]) - Abnormal; Notable for  the following components:      Result Value   SARS Coronavirus 2 POSITIVE (*)    All other components within normal limits  BASIC METABOLIC PANEL - Abnormal; Notable for the following components:   BUN 36 (*)    Creatinine, Ser 1.09 (*)    GFR calc non Af Amer 48 (*)    GFR calc Af Amer 56 (*)    All other components within normal limits  CBC WITH DIFFERENTIAL/PLATELET - Abnormal; Notable for the following components:   RBC 5.71 (*)    Hemoglobin 15.3 (*)    HCT 47.2 (*)    Lymphs Abs 0.5 (*)    All other components within normal limits  URINALYSIS, ROUTINE W REFLEX  MICROSCOPIC - Abnormal; Notable for the following components:   APPearance HAZY (*)    Hgb urine dipstick MODERATE (*)    Ketones, ur 5 (*)    Bacteria, UA RARE (*)    All other components within normal limits  TSH  TROPONIN I (HIGH SENSITIVITY)  TROPONIN I (HIGH SENSITIVITY)    EKG EKG Interpretation  Date/Time:  Thursday October 02 2018 14:36:29 EDT Ventricular Rate:  100 PR Interval:    QRS Duration: 86 QT Interval:  337 QTC Calculation: 435 R Axis:   84 Text Interpretation:  Sinus tachycardia RAE, consider biatrial enlargement Borderline right axis deviation ST elevation, consider inferior injury When compared to prior, no signficant changes seen,  No STEMI Confirmed by Antony Blackbird [phone]) on 10/02/2018 2:59:52 PM   Radiology Dg Chest Port 1 View  Result Date: 10/02/2018 CLINICAL DATA:  Shortness of breath, fever EXAM: PORTABLE CHEST 1 VIEW COMPARISON:  05/28/2014 FINDINGS: Cardiac silhouette appears mildly enlarged. Aorta is calcified and tortuous. Patchy airspace opacity in the left lung base. Right lung appears clear. No pleural effusion or pneumothorax identified. Multiple surgical clips within the left neck. IMPRESSION: Patchy left lower lobe airspace opacity suspicious for pneumonia. Electronically Signed   By: Davina Poke M.D.   On: 10/02/2018 16:27    Procedures Procedures (including critical  care time)  Medications Ordered in ED Medications  0.9 %  sodium chloride infusion ( Intravenous New Bag/Given 10/02/18 2008)  sodium chloride 0.9 % bolus 1,000 mL (0 mLs Intravenous Stopped 10/02/18 2007)  cefTRIAXone (ROCEPHIN) 1 g in sodium chloride 0.9 % 100 mL IVPB (0 g Intravenous Stopped 10/02/18 2008)  azithromycin (ZITHROMAX) 500 mg in sodium chloride 0.9 % 250 mL IVPB (0 mg Intravenous Stopped 10/02/18 2122)     Initial Impression / Assessment and Plan / ED Course  I have reviewed the triage vital signs and the nursing notes.  Pertinent labs & imaging results that were available during my care of the patient were reviewed by me and considered in my medical decision making (see chart for details).  Of note, this patient was evaluated in the Emergency Department for the symptoms described in the history of present illness. She was evaluated in the context of the global COVID-19 pandemic, which necessitated consideration that the patient might be at risk for infection with the SARS-CoV-2 virus that causes COVID-19. Institutional protocols and algorithms that pertain to the evaluation of patients at risk for COVID-19 are in a state of rapid change based on information released by regulatory bodies including the CDC and federal and state organizations. These policies and algorithms were followed during the patient's care in the ED.  During this patient encounter, the patient was wearing a mask, and throughout this encounter I was wearing at least a surgical mask.  I was not within 6 feet of this patient for more than 15 minutes without eye protection when they were not wearing mask.   Differentials considered: CAP, URI, thyrotoxicosis, atypical pneumomia including COVID-19  EM Physician interpretation of Labs & Imaging:  Chest x-ray reveals likely consolidation in left lower lobe.  TSH normal  Troponin negative  BMP with increased BUN and serum creatinine from her baseline.  CBC  without leukocytosis  UA with mild ketonuria  SARS-CoV-2 virus POSITIVE   Medical Decision Making:  [name] is a 78 y.o. female with past medical history as above, significant for HTN, HLD who presents to the emergency department today for 1 week of weakness,  fatigue, productive cough and found to have a low-grade fever with mild tachycardia and dyspnea on exertion.  She arrived hemodynamically stable.  High clinical suspicion for COVID-19, and based on physical examination, she had focal airspace abnormality in the left mid and left lung fields concerning for lobar abnormality.  Chest x-ray reviewed and reveals evidence of a lobar opacification in the left lower lung field.  Rocephin and azithromycin started.  Her COVID-19 nasal swab has resulted as positive.  She was also placed on supplemental oxygen by nasal cannula at 1 L/min.  We will plan to admit her for close observation and IV antibiotics.  Case discussed with [facility], who has accepted the patient for admission to their service.  She will be transferred to the [facility] unit.   The plan for this patient was discussed with my attending physician, Dr. Antony Blackbird, who voiced agreement and who oversaw evaluation and treatment of this patient.   CLINICAL IMPRESSION: 1. Community acquired pneumonia of left lower lobe of lung ([place])   2. COVID-19 virus infection      Disposition: Admit  Curt Oatis A. Jimmye Norman, MD Resident Physician, PGY-3 Emergency Medicine [facility]    Jefm Petty, MD 10/03/18 0045    Tegeler, Gwenyth Allegra, MD 10/05/18 [phone]

## 2018-10-02 NOTE — ED Notes (Signed)
House tray has been ordered

## 2018-10-02 NOTE — ED Triage Notes (Signed)
Pt arrives by EMS with complaints of fever and shortness of breath.  Temp with EMS 100.2. Pt states she has been having symptoms since Monday.  Pt on room air 99% Denies SOB during triage Pt wanting to be tested for covid

## 2018-10-02 NOTE — ED Notes (Signed)
Date and time results received: 10/02/18 1730 (use smartphrase ".now" to insert current time)  Test: COVID  Critical Value: Positive  Name of Provider Notified: .  Orders Received? Or Actions Taken?: .

## 2018-10-03 ENCOUNTER — Encounter (HOSPITAL_COMMUNITY): Payer: Self-pay

## 2018-10-03 DIAGNOSIS — N179 Acute kidney failure, unspecified: Secondary | ICD-10-CM | POA: Diagnosis present

## 2018-10-03 DIAGNOSIS — J9601 Acute respiratory failure with hypoxia: Secondary | ICD-10-CM | POA: Diagnosis present

## 2018-10-03 LAB — CBC
HCT: 46.8 % — ABNORMAL HIGH (ref 36.0–46.0)
Hemoglobin: 14.7 g/dL (ref 12.0–15.0)
MCH: 26.7 pg (ref 26.0–34.0)
MCHC: 31.4 g/dL (ref 30.0–36.0)
MCV: 85.1 fL (ref 80.0–100.0)
Platelets: 149 10*3/uL — ABNORMAL LOW (ref 150–400)
RBC: 5.5 MIL/uL — ABNORMAL HIGH (ref 3.87–5.11)
RDW: 13.8 % (ref 11.5–15.5)
WBC: 4.3 10*3/uL (ref 4.0–10.5)
nRBC: 0 % (ref 0.0–0.2)

## 2018-10-03 LAB — STREP PNEUMONIAE URINARY ANTIGEN: Strep Pneumo Urinary Antigen: NEGATIVE

## 2018-10-03 LAB — CK: Total CK: 110 U/L (ref 38–234)

## 2018-10-03 LAB — CREATININE, SERUM
Creatinine, Ser: 0.72 mg/dL (ref 0.44–1.00)
GFR calc Af Amer: >60 mL/min (ref >60)
GFR calc non Af Amer: >60 mL/min (ref >60)

## 2018-10-03 LAB — FERRITIN: Ferritin: 683 ng/mL — ABNORMAL HIGH (ref 11–307)

## 2018-10-03 LAB — SEDIMENTATION RATE: Sed Rate: 13 mm/hr (ref 0–22)

## 2018-10-03 LAB — PROCALCITONIN: Procalcitonin: <0.1 ng/mL

## 2018-10-03 LAB — ABO/RH: ABO/RH(D): A POS

## 2018-10-03 LAB — C-REACTIVE PROTEIN: CRP: 4.4 mg/dL — ABNORMAL HIGH (ref <1.0)

## 2018-10-03 MED ORDER — AMLODIPINE BESYLATE 5 MG PO TABS
5.0000 mg | ORAL_TABLET | Freq: Every day | ORAL | Status: DC
  Administered 2018-10-03 – 2018-10-04 (×2): 5 mg via ORAL
  Filled 2018-10-03 (×2): qty 1

## 2018-10-03 MED ORDER — ENOXAPARIN SODIUM 40 MG/0.4ML ~~LOC~~ SOLN
40.0000 mg | SUBCUTANEOUS | Status: DC
  Administered 2018-10-04: 40 mg via SUBCUTANEOUS
  Filled 2018-10-03: qty 0.4

## 2018-10-03 MED ORDER — ENSURE ENLIVE PO LIQD
237.0000 mL | Freq: Two times a day (BID) | ORAL | Status: DC
  Administered 2018-10-03 – 2018-10-04 (×2): 237 mL via ORAL

## 2018-10-03 MED ORDER — METHYLPREDNISOLONE SODIUM SUCC 40 MG IJ SOLR
40.0000 mg | Freq: Two times a day (BID) | INTRAMUSCULAR | Status: DC
  Administered 2018-10-03 – 2018-10-04 (×3): 40 mg via INTRAVENOUS
  Filled 2018-10-03 (×3): qty 1

## 2018-10-03 MED ORDER — ADULT MULTIVITAMIN W/MINERALS CH
1.0000 | ORAL_TABLET | Freq: Every day | ORAL | Status: DC
  Administered 2018-10-03 – 2018-10-04 (×2): 1 via ORAL
  Filled 2018-10-03 (×2): qty 1

## 2018-10-03 MED ORDER — ROSUVASTATIN CALCIUM 20 MG PO TABS
20.0000 mg | ORAL_TABLET | Freq: Every evening | ORAL | Status: DC
  Administered 2018-10-03: 20 mg via ORAL
  Filled 2018-10-03: qty 1

## 2018-10-03 MED ORDER — SODIUM CHLORIDE 0.9 % IV SOLN
1.0000 g | INTRAVENOUS | Status: DC
  Administered 2018-10-03 – 2018-10-04 (×2): 1 g via INTRAVENOUS
  Filled 2018-10-03: qty 1
  Filled 2018-10-03: qty 10

## 2018-10-03 MED ORDER — METOPROLOL SUCCINATE ER 100 MG PO TB24
100.0000 mg | ORAL_TABLET | Freq: Every day | ORAL | Status: DC
  Administered 2018-10-03 – 2018-10-04 (×2): 100 mg via ORAL
  Filled 2018-10-03 (×2): qty 1

## 2018-10-03 MED ORDER — LEVOTHYROXINE SODIUM 100 MCG PO TABS: 100.0000 ug | ORAL_TABLET | Freq: Every day | ORAL | Status: DC

## 2018-10-03 MED ORDER — SODIUM CHLORIDE 0.9 % IV SOLN
500.0000 mg | INTRAVENOUS | Status: DC
  Administered 2018-10-03: 500 mg via INTRAVENOUS
  Filled 2018-10-03 (×3): qty 500

## 2018-10-03 MED ORDER — ASPIRIN EC 81 MG PO TBEC
81.0000 mg | DELAYED_RELEASE_TABLET | Freq: Every day | ORAL | Status: DC
  Administered 2018-10-03 – 2018-10-04 (×2): 81 mg via ORAL
  Filled 2018-10-03 (×2): qty 1

## 2018-10-03 MED ORDER — ACETAMINOPHEN 325 MG PO TABS
650.0000 mg | ORAL_TABLET | Freq: Four times a day (QID) | ORAL | Status: DC | PRN
  Administered 2018-10-03: 650 mg via ORAL
  Filled 2018-10-03: qty 2

## 2018-10-03 MED ORDER — LEVOTHYROXINE SODIUM 100 MCG PO TABS
100.0000 ug | ORAL_TABLET | Freq: Every day | ORAL | Status: DC
  Administered 2018-10-04: 100 ug via ORAL
  Filled 2018-10-03: qty 1

## 2018-10-03 MED ORDER — DEXAMETHASONE 4 MG PO TABS
6.0000 mg | ORAL_TABLET | Freq: Every day | ORAL | Status: DC
  Administered 2018-10-03 – 2018-10-04 (×2): 6 mg via ORAL
  Filled 2018-10-03 (×2): qty 2

## 2018-10-03 NOTE — ED Notes (Addendum)
ED TO INPATIENT HANDOFF REPORT  ED Nurse Name and Phone #:   S Name/Age/Gender [name] 78 y.o. female Room/Bed: 022C/022C  Code Status   Code Status: Full Code  Home/SNF/Other Home Patient oriented to: self, place, time and situation Is this baseline? Yes   Triage Complete: Triage complete  Chief Complaint sob,fever  Triage Note Pt arrives by EMS with complaints of fever and shortness of breath.  Temp with EMS 100.2. Pt states she has been having symptoms since Monday.  Pt on room air 99% Denies SOB during triage Pt wanting to be tested for covid   Allergies No Known Allergies  Level of Care/Admitting Diagnosis ED Disposition    ED Disposition Condition [place]: [place] [[id number]]  Level of Care: Med-Surg [16]  Covid Evaluation: Confirmed COVID Positive  Diagnosis: Acute respiratory failure with hypoxia [facility]) [[id number]]  Admitting Physician: Louellen Molder [phone]  Attending Physician: Louellen Molder [phone]  Estimated length of stay: past midnight tomorrow  Certification:: I certify this patient will need inpatient services for at least 2 midnights  PT Class (Do Not Modify): Inpatient [101]  PT Acc Code (Do Not Modify): Private [1]       B Medical/Surgery History Past Medical History:  Diagnosis Date  . History of high cholesterol   . Hypertension    Past Surgical History:  Procedure Laterality Date  . ABDOMINAL HYSTERECTOMY    . BACK SURGERY      X 2  "BEEN A WHILE"  . BREAST SURGERY     REMOVAL OF CYST FROM RIGHT BREAST  . THYROIDECTOMY N/A 05/25/2014   Procedure: COMPLETION THYROIDECTOMY;  Surgeon: [name], MD;  Location: [facility];  Service: General;  Laterality: N/A;  . THYROIDECTOMY, PARTIAL       A IV Location/Drains/Wounds Patient Lines/Drains/Airways Status   Active Line/Drains/Airways    Name:   Placement date:   Placement time:   Site:   Days:   Peripheral IV 10/02/18 Left  Antecubital   10/02/18    1557    Antecubital   1   Peripheral IV 10/03/18 Right Antecubital   10/03/18    1013    Antecubital   less than 1   Subcutaneous Line 05/25/14 Left Arm   05/25/14    1902    Arm   1592   Closed System Drain 1 Right Neck Bulb (JP) 10 Fr.   05/25/14    0955    Neck   1592   Incision (Closed) 05/25/14 Neck Other (Comment)   05/25/14    0802     1592          Intake/Output Last 24 hours  Intake/Output Summary (Last 24 hours) at 10/03/2018 1031 Last data filed at 10/03/2018 1022 Gross per 24 hour  Intake 1790 ml  Output 700 ml  Net 1090 ml    Labs/Imaging Results for orders placed or performed during the hospital encounter of 10/02/18 (from the past 48 hour(s))  Basic metabolic panel     Status: Abnormal   Collection Time: 10/02/18  3:34 PM  Result Value Ref Range   Sodium 140 135 - 145 mmol/L   Potassium 3.7 3.5 - 5.1 mmol/L   Chloride 102 98 - 111 mmol/L   CO2 24 22 - 32 mmol/L   Glucose, Bld 87 70 - 99 mg/dL   BUN 36 (H) 8 - 23 mg/dL   Creatinine, Ser 1.09 (H) 0.44 - 1.00  mg/dL   Calcium 9.0 8.9 - 10.3 mg/dL   GFR calc non Af Amer 48 (L) >60 mL/min   GFR calc Af Amer 56 (L) >60 mL/min   Anion gap 14 5 - 15    Comment: Performed at [facility] [address] [place], [place] [address]  CBC WITH DIFFERENTIAL     Status: Abnormal   Collection Time: 10/02/18  3:34 PM  Result Value Ref Range   WBC 4.7 4.0 - 10.5 K/uL   RBC 5.71 (H) 3.87 - 5.11 MIL/uL   Hemoglobin 15.3 (H) 12.0 - 15.0 g/dL   HCT 47.2 (H) 36.0 - 46.0 %   MCV 82.7 80.0 - 100.0 fL   MCH 26.8 26.0 - 34.0 pg   MCHC 32.4 30.0 - 36.0 g/dL   RDW 13.9 11.5 - 15.5 %   Platelets PLATELET CLUMPS NOTED ON SMEAR, UNABLE TO ESTIMATE 150 - 400 K/uL    Comment: PLATELET CLUMPING, SUGGEST RECOLLECTION OF SAMPLE IN CITRATE TUBE.   nRBC 0.0 0.0 - 0.2 %   Neutrophils Relative % 79 %   Neutro Abs 3.7 1.7 - 7.7 K/uL   Lymphocytes Relative 11 %   Lymphs Abs 0.5 (L) 0.7 - 4.0 K/uL   Monocytes  Relative 10 %   Monocytes Absolute 0.5 0.1 - 1.0 K/uL   Eosinophils Relative 0 %   Eosinophils Absolute 0.0 0.0 - 0.5 K/uL   Basophils Relative 0 %   Basophils Absolute 0.0 0.0 - 0.1 K/uL   Smear Review MORPHOLOGY UNREMARKABLE    Immature Granulocytes 0 %   Abs Immature Granulocytes 0.02 0.00 - 0.07 K/uL    Comment: Performed at [facility] [address], [place], [place] [address]  Urinalysis, Routine w reflex microscopic     Status: Abnormal   Collection Time: 10/02/18  3:34 PM  Result Value Ref Range   Color, Urine YELLOW YELLOW   APPearance HAZY (A) CLEAR   Specific Gravity, Urine 1.016 1.005 - 1.030   pH 5.0 5.0 - 8.0   Glucose, UA NEGATIVE NEGATIVE mg/dL   Hgb urine dipstick MODERATE (A) NEGATIVE   Bilirubin Urine NEGATIVE NEGATIVE   Ketones, ur 5 (A) NEGATIVE mg/dL   Protein, ur NEGATIVE NEGATIVE mg/dL   Nitrite NEGATIVE NEGATIVE   Leukocytes,Ua NEGATIVE NEGATIVE   RBC / HPF 0-5 0 - 5 RBC/hpf   WBC, UA 0-5 0 - 5 WBC/hpf   Bacteria, UA RARE (A) NONE SEEN   Squamous Epithelial / LPF 0-5 0 - 5   Mucus PRESENT    Hyaline Casts, UA PRESENT    Uric Acid Crys, UA PRESENT     Comment: Performed at [facility], [place] [address], [place], [place] [address]  SARS Coronavirus 2 (CEPHEID- Performed in [place] hospital lab), Hosp Order     Status: Abnormal   Collection Time: 10/02/18  3:57 PM   Specimen: Nasopharyngeal Swab  Result Value Ref Range   SARS Coronavirus 2 POSITIVE (A) NEGATIVE    Comment: RESULT CALLED TO, READ BACK BY AND VERIFIED WITH: H HARDY RN 10/02/18 1729 JDW (NOTE) If result is NEGATIVE SARS-CoV-2 target nucleic acids are NOT DETECTED. The SARS-CoV-2 RNA is generally detectable in upper and lower  respiratory specimens during the acute phase of infection. The lowest  concentration of SARS-CoV-2 viral copies this assay can detect is 250  copies / mL. A negative result does not preclude SARS-CoV-2 infection  and should not be used as  the sole  basis for treatment or other  patient management decisions.  A negative result may occur with  improper specimen collection / handling, submission of specimen other  than nasopharyngeal swab, presence of viral mutation(s) within the  areas targeted by this assay, and inadequate number of viral copies  (<250 copies / mL). A negative result must be combined with clinical  observations, patient history, and epidemiological information. If result is POSITIVE SARS-CoV-2 target nucleic acids are DETECTED. The SARS- CoV-2 RNA is generally detectable in upper and lower  respiratory specimens during the acute phase of infection.  Positive  results are indicative of active infection with SARS-CoV-2.  Clinical  correlation with patient history and other diagnostic information is  necessary to determine patient infection status.  Positive results do  not rule out bacterial infection or co-infection with other viruses. If result is PRESUMPTIVE POSTIVE SARS-CoV-2 nucleic acids MAY BE PRESENT.   A presumptive positive result was obtained on the submitted specimen  and confirmed on repeat testing.  While 2019 novel coronavirus  (SARS-CoV-2) nucleic acids may be present in the submitted sample  additional confirmatory testing may be necessary for epidemiological  and / or clinical management purposes  to differentiate between  SARS-CoV-2 and other Sarbecovirus currently known to infect humans.  If clinically indicated additional testing with an alternate test  methodology [phone]) is advised . The SARS-CoV-2 RNA is generally  detectable in upper and lower respiratory specimens during the acute  phase of infection. The expected result is Negative. Fact Sheet for Patients:  StrictlyIdeas.no Fact Sheet for Healthcare Providers: BankingDealers.co.za This test is not yet approved or cleared by the [place] FDA and has been authorized for detection  and/or diagnosis of SARS-CoV-2 by FDA under an Emergency Use Authorization (EUA).  This EUA will remain in effect (meaning this test can be used) for the duration of the COVID-19 declaration under Section 564(b)(1) of the Act, 21 U.S.C. section 360bbb-3(b)(1), unless the authorization is terminated or revoked sooner. Performed at [facility], [place] [address], [place], [place] [address]   TSH     Status: None   Collection Time: 10/02/18  4:40 PM  Result Value Ref Range   TSH 0.528 0.350 - 4.500 uIU/mL    Comment: Performed by a 3rd Generation assay with a functional sensitivity of <=0.01 uIU/mL. Performed at [facility], [place] [address] [place], [place] [address]   Troponin I (High Sensitivity)     Status: None   Collection Time: 10/02/18  7:38 PM  Result Value Ref Range   Troponin I (High Sensitivity) 11 <18 ng/L    Comment: (NOTE) Elevated high sensitivity troponin I (hsTnI) values and significant  changes across serial measurements may suggest ACS but many other  chronic and acute conditions are known to elevate hsTnI results.  Refer to the "Links" section for chest pain algorithms and additional  guidance. Performed at [facility], [place] [address], [place], [place] [address]    Dg Chest Port 1 View  Result Date: 10/02/2018 CLINICAL DATA:  Shortness of breath, fever EXAM: PORTABLE CHEST 1 VIEW COMPARISON:  05/28/2014 FINDINGS: Cardiac silhouette appears mildly enlarged. Aorta is calcified and tortuous. Patchy airspace opacity in the left lung base. Right lung appears clear. No pleural effusion or pneumothorax identified. Multiple surgical clips within the left neck. IMPRESSION: Patchy left lower lobe airspace opacity suspicious for pneumonia. Electronically Signed   By: Davina Poke M.D.   On: 10/02/2018  16:27    Pending Labs Unresulted Labs (From admission, onward)    Start     Ordered   10/09/18 0500  Creatinine, serum  (enoxaparin (LOVENOX)    CrCl  >/= 30 ml/min)  Weekly,   R    Comments: while on enoxaparin therapy    10/03/18 0926   10/04/18 0500  Ferritin  Daily,   R     10/03/18 0926   10/04/18 0500  D-dimer, quantitative (not at [facility])  Daily,   R     10/03/18 0926   10/04/18 0500  C-reactive protein  Daily,   R     10/03/18 0926   10/04/18 0500  Triglycerides  Daily,   R     10/03/18 0926   10/04/18 0500  Interleukin-6, Plasma  Daily,   R     10/03/18 0926   10/04/18 0500  D-dimer, quantitative (not at [facility])  Daily,   R     10/03/18 0856   10/04/18 0500  C-reactive protein  Daily,   R     10/03/18 0856   10/04/18 0500  Ferritin  Daily,   R     10/03/18 0856   10/03/18 0927  ABO/Rh  Once,   STAT     10/03/18 0926   10/03/18 0927  CBC  (enoxaparin (LOVENOX)    CrCl >/= 30 ml/min)  Once,   STAT    Comments: Baseline for enoxaparin therapy IF NOT ALREADY DRAWN.  Notify MD if PLT < 100 K.    10/03/18 0926   10/03/18 0927  Creatinine, serum  (enoxaparin (LOVENOX)    CrCl >/= 30 ml/min)  Once,   STAT    Comments: Baseline for enoxaparin therapy IF NOT ALREADY DRAWN.    10/03/18 0926   10/03/18 0927  Culture, sputum-assessment  Once,   R     10/03/18 0926   10/03/18 0927  C-reactive protein  Once,   STAT     10/03/18 0926   10/03/18 0927  Ferritin  Once,   STAT     10/03/18 0926   10/03/18 0927  Interleukin-6, Plasma  Once,   STAT     10/03/18 0926   10/03/18 0927  Procalcitonin  Once,   STAT     10/03/18 0926   10/03/18 0927  Sedimentation rate  Once,   STAT     10/03/18 0926   10/03/18 0927  Legionella Pneumophila Serogp 1 Ur Ag  Once,   STAT     10/03/18 0926   10/03/18 0927  Strep pneumoniae urinary antigen  Once,   STAT     10/03/18 0926   10/03/18 0927  D-dimer, quantitative (not at [facility])  Once,   STAT     10/03/18 0926   10/03/18 0927  CK  Once,   STAT     10/03/18 0926          Vitals/Pain Today's Vitals   10/03/18 0745 10/03/18 0815 10/03/18 0930 10/03/18 1000  BP: (!) 141/65 138/64 (!) 157/72 (!)  160/74  Pulse: 87 88 93 92  Resp: (!) 22 (!) 27 19 (!) 28  Temp:      TempSrc:      SpO2: 99% 100% 100% 100%  Weight:      Height:      PainSc:        Isolation Precautions Airborne and Contact precautions  Medications Medications  aspirin EC tablet 81 mg (81 mg Oral Given 10/03/18 1006)  amLODipine (  NORVASC) tablet 5 mg (5 mg Oral Given 10/03/18 1006)  metoprolol succinate (TOPROL-XL) 24 hr tablet 100 mg (100 mg Oral Given 10/03/18 1006)  rosuvastatin (CRESTOR) tablet 20 mg (has no administration in time range)  levothyroxine (SYNTHROID) tablet 100 mcg (has no administration in time range)  multivitamin with minerals tablet 1 tablet (1 tablet Oral Given 10/03/18 1006)  enoxaparin (LOVENOX) injection 40 mg (40 mg Subcutaneous Refused 10/03/18 1007)  0.9 %  sodium chloride infusion ( Intravenous New Bag/Given 10/02/18 2008)  dexamethasone (DECADRON) tablet 6 mg (6 mg Oral Given 10/03/18 1005)  azithromycin (ZITHROMAX) 500 mg in sodium chloride 0.9 % 250 mL IVPB (has no administration in time range)  cefTRIAXone (ROCEPHIN) 1 g in sodium chloride 0.9 % 100 mL IVPB (0 g Intravenous Stopped 10/03/18 1022)  methylPREDNISolone sodium succinate (SOLU-MEDROL) 40 mg/mL injection 40 mg (40 mg Intravenous Given 10/03/18 1007)  sodium chloride 0.9 % bolus 1,000 mL (0 mLs Intravenous Stopped 10/02/18 2007)  cefTRIAXone (ROCEPHIN) 1 g in sodium chloride 0.9 % 100 mL IVPB (0 g Intravenous Stopped 10/02/18 2008)  azithromycin (ZITHROMAX) 500 mg in sodium chloride 0.9 % 250 mL IVPB (0 mg Intravenous Stopped 10/02/18 2122)    Mobility walks Low fall risk   Focused Assessments Pulmonary Assessment Handoff:  Lung sounds: Bilateral Breath Sounds: Diminished L Breath Sounds: Diminished R Breath Sounds: Diminished O2 Device: Room Air        R Recommendations: See Admitting Provider Note  Report given to:   Additional Notes:  Acute respiratory failure with hypoxia (HCC) Left lobar  pneumonia Associated with COVID-19 infection.  Maintain airborne and contact precaution.  Hemodynamically stable at this time. She is only on 1 L via nasal cannula and sats in mid to high 90s.  Add IV Solu-Medrol 40 mg every 12 hours.  Continue supplemental oxygen.  On Rocephin and azithromycin.  Added gentle hydration for dehydration. Inflammatory markers are currently pending and will be followed daily.  (Primarily d-dimer, ferritin and CRP). Monitor on telemetry for now. Patient stable

## 2018-10-03 NOTE — ED Notes (Signed)
Breakfast tray ordered 

## 2018-10-03 NOTE — Progress Notes (Signed)
PROGRESS NOTE                                                                                                                                                                                                             Patient Demographics:    [name], is a 78 y.o. female, DOB - March 14, 1940, VWP:[id number]  Admit date - 10/02/2018   Admitting Physician No admitting provider for patient encounter.  Outpatient Primary MD for the patient is Nolene Ebbs, MD  LOS - 1  Outpatient Specialists:  Chief Complaint  Patient presents with  . Shortness of Breath       Brief Narrative   78 year old African-American female with history of hypertension and hyperlipidemia with 3 days of low-grade fever and shortness of breath.  Denied any cough, headache, URI symptoms, chest pain, palpitation, abdominal pain, dysuria or diarrhea.  Denied any loss of smell or taste.  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  In the ED her sats were in the mid 90s on room air but dropped to high 80s on ambulation.  Blood pressure was 99.6 F, heart rate 96-103 bpm.  Respiratory rate of 16-29/min.  Blood pressure was 156/75 mmHg.  COVID-19 was tested positive in the ED.  Chest x-ray showed left lower lobe infiltrate. Blood work showed normal WBC and hemoglobin.  Chemistry was unremarkable except for mild acute kidney injury with creatinine of 1.09. Patient admitted with findings of acute respiratory failure with hypoxia secondary to COVID-19 infection and possible left lobar pneumonia. In the ED she received IV Rocephin and azithromycin and started on Decadron.  Patient admitted to be transferred to [facility] for further care.  Patient lives with her husband who is also admitted with acute respiratory failure secondary to COVID-19.   Subjective:   Patient seen and examined.  Reports her breathing to be  better since she came in.  Denies any cough or chest pain.  Remains afebrile.  Assessment  & Plan :   Principal problem Acute respiratory failure with hypoxia (HCC) Left lobar pneumonia Associated with COVID-19 infection.  Maintain airborne and contact precaution.  Hemodynamically stable at this time.  She is only on 1 L via nasal cannula and sats in mid to high 90s.  Add IV Solu-Medrol 40 mg every 12 hours.  Continue supplemental oxygen.  On empiric Rocephin and azithromycin.  Added gentle hydration for dehydration. Inflammatory markers are currently pending and will be followed daily.  (Primarily d-dimer, ferritin and CRP). Monitor on telemetry for now. Patient stable to be transferred to [facility] for further care. Given her stable respiratory status patient currently does not need Actemra or Remdisivir.  Essential hypertension Blood pressure mildly elevated.  I will resume her home blood pressure meds.  Resume her aspirin, amlodipine, Toprol and Synthroid.  Acute kidney injury Mild.  Monitor with gentle hydration.  Avoid nephrotoxins.  Holding her Crestor until CK is back.  Code Status : Full code  Family Communication  : Discussed with patient at bedside.  Informed husband who is admitted as well.  Disposition Plan  : Transfer to [facility]  Barriers For Discharge : Active symptoms  Consults  : None  Procedures  : None  DVT Prophylaxis  :  Lovenox   Lab Results  Component Value Date   PLT PLATELET CLUMPS NOTED ON SMEAR, UNABLE TO ESTIMATE 10/02/2018    Antibiotics  :    Anti-infectives (From admission, onward)   Start     Dose/Rate Route Frequency Ordered Stop   10/02/18 1800  cefTRIAXone (ROCEPHIN) 1 g in sodium chloride 0.9 % 100 mL IVPB     1 g 200 mL/hr over 30 Minutes Intravenous  Once 10/02/18 1753 10/02/18 2008   10/02/18 1800  azithromycin (ZITHROMAX) 500 mg in sodium chloride 0.9 % 250 mL IVPB     500 mg 250 mL/hr over 60 Minutes  Intravenous  Once 10/02/18 1753 10/02/18 2122        Objective:   Vitals:   10/03/18 0700 10/03/18 0715 10/03/18 0730 10/03/18 0745  BP: (!) 148/71 (!) 148/78 (!) 152/65 (!) 141/65  Pulse: 87 96 87 87  Resp: (!) 22 16 (!) 22 (!) 22  Temp:      TempSrc:      SpO2: 100% 100% 100% 99%  Weight:      Height:        Wt Readings from Last 3 Encounters:  10/02/18 59 kg  05/29/14 59 kg  05/25/14 62.6 kg     Intake/Output Summary (Last 24 hours) at 10/03/2018 0843 Last data filed at 10/02/2018 2122 Gross per 24 hour  Intake 1450 ml  Output -  Net 1450 ml     Physical Exam  Gen: Elderly thin built female, not in distress HEENT: no pallor, moist mucosa, supple neck Chest: clear b/l, no added sounds CVS: N S1&S2, no murmurs, rubs or gallop GI: soft, NT, ND, BS+ Musculoskeletal: warm, no edema     Data Review:    CBC Recent Labs  Lab 10/02/18 1534  WBC 4.7  HGB 15.3*  HCT 47.2*  PLT PLATELET CLUMPS NOTED ON SMEAR, UNABLE TO ESTIMATE  MCV 82.7  MCH 26.8  MCHC 32.4  RDW 13.9  LYMPHSABS 0.5*  MONOABS 0.5  EOSABS 0.0  BASOSABS 0.0    Chemistries  Recent Labs  Lab 10/02/18 1534  NA 140  K 3.7  CL 102  CO2 24  GLUCOSE 87  BUN 36*  CREATININE 1.09*  CALCIUM 9.0   ------------------------------------------------------------------------------------------------------------------ No results for input(s): CHOL, HDL, LDLCALC, TRIG, CHOLHDL, LDLDIRECT in the last 72 hours.  No results found for: HGBA1C ------------------------------------------------------------------------------------------------------------------ Recent Labs    10/02/18 1640  TSH 0.528   ------------------------------------------------------------------------------------------------------------------ No results for input(s): VITAMINB12, FOLATE, FERRITIN, TIBC, IRON, RETICCTPCT in the last  72 hours.  Coagulation profile No results for input(s): INR, PROTIME in the last 168 hours.  No  results for input(s): DDIMER in the last 72 hours.  Cardiac Enzymes No results for input(s): CKMB, TROPONINI, MYOGLOBIN in the last 168 hours.  Invalid input(s): CK ------------------------------------------------------------------------------------------------------------------ No results found for: BNP  Inpatient Medications  Scheduled Meds: Continuous Infusions: . sodium chloride 50 mL/hr at 10/02/18 2008   PRN Meds:.  Micro Results Recent Results (from the past 240 hour(s))  SARS Coronavirus 2 (CEPHEID- Performed in [place] hospital lab), Hosp Order     Status: Abnormal   Collection Time: 10/02/18  3:57 PM   Specimen: Nasopharyngeal Swab  Result Value Ref Range Status   SARS Coronavirus 2 POSITIVE (A) NEGATIVE Final    Comment: RESULT CALLED TO, READ BACK BY AND VERIFIED WITH: H HARDY RN 10/02/18 1729 JDW (NOTE) If result is NEGATIVE SARS-CoV-2 target nucleic acids are NOT DETECTED. The SARS-CoV-2 RNA is generally detectable in upper and lower  respiratory specimens during the acute phase of infection. The lowest  concentration of SARS-CoV-2 viral copies this assay can detect is 250  copies / mL. A negative result does not preclude SARS-CoV-2 infection  and should not be used as the sole basis for treatment or other  patient management decisions.  A negative result may occur with  improper specimen collection / handling, submission of specimen other  than nasopharyngeal swab, presence of viral mutation(s) within the  areas targeted by this assay, and inadequate number of viral copies  (<250 copies / mL). A negative result must be combined with clinical  observations, patient history, and epidemiological information. If result is POSITIVE SARS-CoV-2 target nucleic acids are DETECTED. The SARS- CoV-2 RNA is generally detectable in upper and lower  respiratory specimens during the acute phase of infection.  Positive  results are indicative of active infection with  SARS-CoV-2.  Clinical  correlation with patient history and other diagnostic information is  necessary to determine patient infection status.  Positive results do  not rule out bacterial infection or co-infection with other viruses. If result is PRESUMPTIVE POSTIVE SARS-CoV-2 nucleic acids MAY BE PRESENT.   A presumptive positive result was obtained on the submitted specimen  and confirmed on repeat testing.  While 2019 novel coronavirus  (SARS-CoV-2) nucleic acids may be present in the submitted sample  additional confirmatory testing may be necessary for epidemiological  and / or clinical management purposes  to differentiate between  SARS-CoV-2 and other Sarbecovirus currently known to infect humans.  If clinically indicated additional testing with an alternate test  methodology [phone]) is advised . The SARS-CoV-2 RNA is generally  detectable in upper and lower respiratory specimens during the acute  phase of infection. The expected result is Negative. Fact Sheet for Patients:  StrictlyIdeas.no Fact Sheet for Healthcare Providers: BankingDealers.co.za This test is not yet approved or cleared by the [place] FDA and has been authorized for detection and/or diagnosis of SARS-CoV-2 by FDA under an Emergency Use Authorization (EUA).  This EUA will remain in effect (meaning this test can be used) for the duration of the COVID-19 declaration under Section 564(b)(1) of the Act, 21 U.S.C. section 360bbb-3(b)(1), unless the authorization is terminated or revoked sooner. Performed at [facility], [place] [address], [place], [place] [address]     Radiology Reports Dg Chest [place] 1 View  Result Date: 10/02/2018 CLINICAL DATA:  Shortness of breath, fever EXAM: PORTABLE CHEST 1 VIEW COMPARISON:  05/28/2014 FINDINGS:  Cardiac silhouette appears mildly enlarged. Aorta is calcified and tortuous. Patchy airspace opacity in the left lung  base. Right lung appears clear. No pleural effusion or pneumothorax identified. Multiple surgical clips within the left neck. IMPRESSION: Patchy left lower lobe airspace opacity suspicious for pneumonia. Electronically Signed   By: Davina Poke M.D.   On: 10/02/2018 16:27    Time Spent in minutes 35   Assia Meanor M.D on 10/03/2018 at 8:43 AM  Between 7am to 7pm - Pager - [phone]  After 7pm go to [email or web address] - password [facility]  [facility] -  Office  [phone]

## 2018-10-03 NOTE — ED Notes (Signed)
Pt sister Ulis Rias [phone]

## 2018-10-03 NOTE — Progress Notes (Signed)
Pt c/o body aches and chest wall discomfort. No prn meds ordered will notify MD.

## 2018-10-03 NOTE — ED Notes (Signed)
Pt placed on hospital bed for comfort.

## 2018-10-04 DIAGNOSIS — U071 COVID-19: Secondary | ICD-10-CM

## 2018-10-04 DIAGNOSIS — J181 Lobar pneumonia, unspecified organism: Secondary | ICD-10-CM

## 2018-10-04 DIAGNOSIS — J189 Pneumonia, unspecified organism: Secondary | ICD-10-CM

## 2018-10-04 DIAGNOSIS — J9601 Acute respiratory failure with hypoxia: Secondary | ICD-10-CM

## 2018-10-04 LAB — LEGIONELLA PNEUMOPHILA SEROGP 1 UR AG: L. pneumophila Serogp 1 Ur Ag: NEGATIVE

## 2018-10-04 LAB — FERRITIN: Ferritin: 571 ng/mL — ABNORMAL HIGH (ref 11–307)

## 2018-10-04 LAB — D-DIMER, QUANTITATIVE: D-Dimer, Quant: 0.67 ug/mL-FEU — ABNORMAL HIGH (ref 0.00–0.50)

## 2018-10-04 LAB — C-REACTIVE PROTEIN: CRP: 4.6 mg/dL — ABNORMAL HIGH (ref <1.0)

## 2018-10-04 LAB — TRIGLYCERIDES: Triglycerides: 69 mg/dL (ref <150)

## 2018-10-04 MED ORDER — ENSURE ENLIVE PO LIQD: 237.0000 mL | Freq: Two times a day (BID) | ORAL | 12 refills | Status: DC

## 2018-10-04 MED ORDER — PREDNISONE 20 MG PO TABS: 20.0000 mg | ORAL_TABLET | Freq: Every day | ORAL | 0 refills | Status: DC

## 2018-10-04 MED ORDER — AMOXICILLIN-POT CLAVULANATE 875-125 MG PO TABS: 1.0000 | ORAL_TABLET | Freq: Two times a day (BID) | ORAL | 0 refills | Status: AC

## 2018-10-04 NOTE — Discharge Instructions (Signed)
COVID-19: How to Protect Yourself and Others °Know how it spreads °· There is currently no vaccine to prevent coronavirus disease 2019 (COVID-19). °· The best way to prevent illness is to avoid being exposed to this virus. °· The virus is thought to spread mainly from person-to-person. °? Between people who are in close contact with one another (within about 6 feet). °? Through respiratory droplets produced when an infected person coughs, sneezes or talks. °? These droplets can land in the mouths or noses of people who are nearby or possibly be inhaled into the lungs. °? Some recent studies have suggested that COVID-19 may be spread by people who are not showing symptoms. °Everyone should °Clean your hands often °· Wash your hands often with soap and water for at least 20 seconds especially after you have been in a public place, or after blowing your nose, coughing, or sneezing. °· If soap and water are not readily available, use a hand sanitizer that contains at least 60% alcohol. Cover all surfaces of your hands and rub them together until they feel dry. °· Avoid touching your eyes, nose, and mouth with unwashed hands. °Avoid close contact °· Stay home if you are sick. °· Avoid close contact with people who are sick. °· Put distance between yourself and other people. °? Remember that some people without symptoms may be able to spread virus. °? This is especially important for people who are at higher risk of getting very sick.www.cdc.gov/coronavirus/2019-ncov/need-extra-precautions/people-at-higher-risk.html °Cover your mouth and nose with a cloth face cover when around others °· You could spread COVID-19 to others even if you do not feel sick. °· Everyone should wear a cloth face cover when they have to go out in public, for example to the grocery store or to pick up other necessities. °? Cloth face coverings should not be placed on young children under age 2, anyone who has trouble breathing, or is unconscious,  incapacitated or otherwise unable to remove the mask without assistance. °· The cloth face cover is meant to protect other people in case you are infected. °· Do NOT use a facemask meant for a healthcare worker. °· Continue to keep about 6 feet between yourself and others. The cloth face cover is not a substitute for social distancing. °Cover coughs and sneezes °· If you are in a private setting and do not have on your cloth face covering, remember to always cover your mouth and nose with a tissue when you cough or sneeze or use the inside of your elbow. °· Throw used tissues in the trash. °· Immediately wash your hands with soap and water for at least 20 seconds. If soap and water are not readily available, clean your hands with a hand sanitizer that contains at least 60% alcohol. °Clean and disinfect °· Clean AND disinfect frequently touched surfaces daily. This includes tables, doorknobs, light switches, countertops, handles, desks, phones, keyboards, toilets, faucets, and sinks. www.cdc.gov/coronavirus/2019-ncov/prevent-getting-sick/disinfecting-your-home.html °· If surfaces are dirty, clean them: Use detergent or soap and water prior to disinfection. °· Then, use a household disinfectant. You can see a list of EPA-registered household disinfectants here. °cdc.gov/coronavirus °07/15/2018 °This information is not intended to replace advice given to you by your health care provider. Make sure you discuss any questions you have with your health care provider. °Document Released: 06/24/2018 Document Revised: 07/23/2018 Document Reviewed: 06/24/2018 °[employer] Patient Education © 2020 [employer] °COVID-19 Frequently Asked Questions °COVID-19 (coronavirus disease) is an infection that is caused by a large family of   viruses. Some viruses cause illness in people and others cause illness in animals like camels, cats, and bats. In some cases, the viruses that cause illness in animals can spread to humans. °Where did the  coronavirus come from? °In December 2019, [place] told the World Health Organization (WHO) of several cases of lung disease (human respiratory illness). These cases were linked to an open seafood and livestock market in the city of [place]. The link to the seafood and livestock market suggests that the virus may have spread from animals to humans. However, since that first outbreak in December, the virus has also been shown to spread from person to person. °What is the name of the disease and the virus? °Disease name °Early on, this disease was called novel coronavirus. This is because scientists determined that the disease was caused by a new (novel) respiratory virus. The World Health Organization (WHO) has now named the disease COVID-19, or coronavirus disease. °Virus name °The virus that causes the disease is called severe acute respiratory syndrome coronavirus 2 (SARS-CoV-2). °More information on disease and virus naming °World Health Organization (WHO): www.who.int/emergencies/diseases/novel-coronavirus-2019/technical-guidance/naming-the-coronavirus-disease-(covid-2019)-and-the-virus-that-causes-it °Who is at risk for complications from coronavirus disease? °Some people may be at higher risk for complications from coronavirus disease. This includes older adults and people who have chronic diseases, such as heart disease, diabetes, and lung disease. °If you are at higher risk for complications, take these extra precautions: °· Avoid close contact with people who are sick or have a fever or cough. Stay at least 3-6 ft (1-2 m) away from them, if possible. °· Wash your hands often with soap and water for at least 20 seconds. °· Avoid touching your face, mouth, nose, or eyes. °· Keep supplies on hand at home, such as food, medicine, and cleaning supplies. °· Stay home as much as possible. °· Avoid social gatherings and travel. °How does coronavirus disease spread? °The virus that causes coronavirus disease spreads  easily from person to person (is contagious). There are also cases of community-spread disease. This means the disease has spread to: °· People who have no known contact with other infected people. °· People who have not traveled to areas where there are known cases. °It appears to spread from one person to another through droplets from coughing or sneezing. °Can I get the virus from touching surfaces or objects? °There is still a lot that we do not know about the virus that causes coronavirus disease. Scientists are basing a lot of information on what they know about similar viruses, such as: °· Viruses cannot generally survive on surfaces for long. They need a human body (host) to survive. °· It is more likely that the virus is spread by close contact with people who are sick (direct contact), such as through: °? Shaking hands or hugging. °? Breathing in respiratory droplets that travel through the air. This can happen when an infected person coughs or sneezes on or near other people. °· It is less likely that the virus is spread when a person touches a surface or object that has the virus on it (indirect contact). The virus may be able to enter the body if the person touches a surface or object and then touches his or her face, eyes, nose, or mouth. °Can a person spread the virus without having symptoms of the disease? °It may be possible for the virus to spread before a person has symptoms of the disease, but this is most likely not the main way the virus is   spreading. It is more likely for the virus to spread by being in close contact with people who are sick and breathing in the respiratory droplets of a sick person's cough or sneeze. °What are the symptoms of coronavirus disease? °Symptoms vary from person to person and can range from mild to severe. Symptoms may include: °· Fever. °· Cough. °· Tiredness, weakness, or fatigue. °· Fast breathing or feeling short of breath. °These symptoms can appear anywhere  from 2 to 14 days after you have been exposed to the virus. If you develop symptoms, call your health care provider. People with severe symptoms may need hospital care. °If I am exposed to the virus, how long does it take before symptoms start? °Symptoms of coronavirus disease may appear anywhere from 2 to 14 days after a person has been exposed to the virus. If you develop symptoms, call your health care provider. °Should I be tested for this virus? °Your health care provider will decide whether to test you based on your symptoms, history of exposure, and your risk factors. °How does a health care provider test for this virus? °Health care providers will collect samples to send for testing. Samples may include: °· Taking a swab of fluid from the nose. °· Taking fluid from the lungs by having you cough up mucus (sputum) into a sterile cup. °· Taking a blood sample. °· Taking a stool or urine sample. °Is there a treatment or vaccine for this virus? °Currently, there is no vaccine to prevent coronavirus disease. Also, there are no medicines like antibiotics or antivirals to treat the virus. A person who becomes sick is given supportive care, which means rest and fluids. A person may also relieve his or her symptoms by using over-the-counter medicines that treat sneezing, coughing, and runny nose. These are the same medicines that a person takes for the common cold. °If you develop symptoms, call your health care provider. People with severe symptoms may need hospital care. °What can I do to protect myself and my family from this virus? ° °  ° °You can protect yourself and your family by taking the same actions that you would take to prevent the spread of other viruses. Take the following actions: °· Wash your hands often with soap and water for at least 20 seconds. If soap and water are not available, use alcohol-based hand sanitizer. °· Avoid touching your face, mouth, nose, or eyes. °· Cough or sneeze into a tissue,  sleeve, or elbow. Do not cough or sneeze into your hand or the air. °? If you cough or sneeze into a tissue, throw it away immediately and wash your hands. °· Disinfect objects and surfaces that you frequently touch every day. °· Avoid close contact with people who are sick or have a fever or cough. Stay at least 3-6 ft (1-2 m) away from them, if possible. °· Stay home if you are sick, except to get medical care. Call your health care provider before you get medical care. °· Make sure your vaccines are up to date. Ask your health care provider what vaccines you need. °What should I do if I need to travel? °Follow travel recommendations from your local health authority, the CDC, and WHO. °Travel information and advice °· Centers for Disease Control and Prevention (CDC): www.cdc.gov/coronavirus/2019-ncov/travelers/index.html °· World Health Organization (WHO): www.who.int/emergencies/diseases/novel-coronavirus-2019/travel-advice °Know the risks and take action to protect your health °· You are at higher risk of getting coronavirus disease if you are traveling to areas with   an outbreak or if you are exposed to travelers from areas with an outbreak. °· Wash your hands often and practice good hygiene to lower the risk of catching or spreading the virus. °What should I do if I am sick? °General instructions to stop the spread of infection °· Wash your hands often with soap and water for at least 20 seconds. If soap and water are not available, use alcohol-based hand sanitizer. °· Cough or sneeze into a tissue, sleeve, or elbow. Do not cough or sneeze into your hand or the air. °· If you cough or sneeze into a tissue, throw it away immediately and wash your hands. °· Stay home unless you must get medical care. Call your health care provider or local health authority before you get medical care. °· Avoid public areas. Do not take public transportation, if possible. °· If you can, wear a mask if you must go out of the house  or if you are in close contact with someone who is not sick. °Keep your home clean °· Disinfect objects and surfaces that are frequently touched every day. This may include: °? Counters and tables. °? Doorknobs and light switches. °? Sinks and faucets. °? Electronics such as phones, remote controls, keyboards, computers, and tablets. °· Wash dishes in hot, soapy water or use a dishwasher. Air-dry your dishes. °· Wash laundry in hot water. °Prevent infecting other household members °· Let healthy household members care for children and pets, if possible. If you have to care for children or pets, wash your hands often and wear a mask. °· Sleep in a different bedroom or bed, if possible. °· Do not share personal items, such as razors, toothbrushes, deodorant, combs, brushes, towels, and washcloths. °Where to find more information °Centers for Disease Control and Prevention (CDC) °· Information and news updates: www.cdc.gov/coronavirus/2019-ncov °World Health Organization (WHO) °· Information and news updates: www.who.int/emergencies/diseases/novel-coronavirus-2019 °· Coronavirus health topic: www.who.int/health-topics/coronavirus °· Questions and answers on COVID-19: www.who.int/news-room/q-a-detail/q-a-coronaviruses °· Global tracker: who.sprinklr.com °American Academy of Pediatrics (AAP) °· Information for families: www.healthychildren.org/English/health-issues/conditions/chest-lungs/Pages/2019-Novel-Coronavirus.aspx °The coronavirus situation is changing rapidly. Check your local health authority website or the CDC and WHO websites for updates and news. °When should I contact a health care provider? °· Contact your health care provider if you have symptoms of an infection, such as fever or cough, and you: °? Have been near anyone who is known to have coronavirus disease. °? Have come into contact with a person who is suspected to have coronavirus disease. °? Have traveled outside of the country. °When should I get  emergency medical care? °· Get help right away by calling your local emergency services (911 in the [place] if you have: °? Trouble breathing. °? Pain or pressure in your chest. °? Confusion. °? Blue-tinged lips and fingernails. °? Difficulty waking from sleep. °? Symptoms that get worse. °Let the emergency medical personnel know if you think you have coronavirus disease. °Summary °· A new respiratory virus is spreading from person to person and causing COVID-19 (coronavirus disease). °· The virus that causes COVID-19 appears to spread easily. It spreads from one person to another through droplets from coughing or sneezing. °· Older adults and those with chronic diseases are at higher risk of disease. If you are at higher risk for complications, take extra precautions. °· There is currently no vaccine to prevent coronavirus disease. There are no medicines, such as antibiotics or antivirals, to treat the virus. °· You can protect yourself and your family by washing your   hands often, avoiding touching your face, and covering your coughs and sneezes. °This information is not intended to replace advice given to you by your health care provider. Make sure you discuss any questions you have with your health care provider. °Document Released: 06/24/2018 Document Revised: 06/24/2018 Document Reviewed: 06/24/2018 °[employer] Patient Education © 2020 [employer] ° °

## 2018-10-04 NOTE — Discharge Summary (Signed)
Physician Discharge Summary  [name] KVQ:[id number] DOB: 1940/06/21 DOA: 10/02/2018  PCP: Nolene Ebbs, MD  Admit date: 10/02/2018 Discharge date: 10/04/2018  Admitted From: home Disposition:  home  Recommendations for Outpatient Follow-up:  1. Discharged home with self quarantine for 14 days for COVID-19 infection.. 2. Patient will complete 5-day course of antibiotic after 7/28.  Home Health: None Equipment/Devices: None  Discharge Condition: Fair CODE STATUS: Full code Diet recommendation: Regular   Discharge Diagnoses:  Principal Problem:   Pneumonia due to severe acute respiratory syndrome coronavirus 2 (SARS-CoV-2)  Active Problems:   Thyroid nodule   Hypertension   History of high cholesterol   Acute respiratory failure with hypoxia (HCC)   AKI (acute kidney injury) ([place])   Brief narrative/HPI 78 year old African-American female with history of hypertension and hyperlipidemia with 3 days of low-grade fever and shortness of breath.  Denied any cough, headache, URI symptoms, chest pain, palpitation, abdominal pain, dysuria or diarrhea.  Denied any loss of smell or taste.  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  In the ED her sats were in the mid 90s on room air but dropped to high 80s on ambulation.  Blood pressure was 99.6 F, heart rate 96-103 bpm.  Respiratory rate of 16-29/min.  Blood pressure was 156/75 mmHg.  COVID-19 was tested positive in the ED.  Chest x-ray showed left lower lobe infiltrate. Blood work showed normal WBC and hemoglobin.  Chemistry was unremarkable except for mild acute kidney injury with creatinine of 1.09. Patient admitted with findings of acute respiratory failure with hypoxia secondary to COVID-19 infection and possible left lobar pneumonia. In the ED she received IV Rocephin and azithromycin and started on Decadron.  Patient admitted to be transferred to [facility] for further care.   Patient lives with her husband who is also admitted with acute respiratory failure secondary to COVID-19.  Principal problem  Acute respiratory failure with hypoxia ([place]) Left lobar pneumonia Associated with COVID-19 infection.  Maintain airborne and contact precaution. Patient has remained stable on room air for past 24 hours.  Her sats are in the mid 90s both at rest and on ambulation. Patient placed on IV Solu-Medrol.  Hemodynamically remains stable.  I will discharge on oral prednisone taper over the next 12 days. She was also receiving IV Rocephin and azithromycin for her pneumonia and will be discharged on oral Augmentin to complete 5-day course. Patient stable to be discharged home with detailed instructions on protecting herself and others from spread of the illness, clean hand hygiene, and self quarantine for 14 days.  Also instructed on returning to the ED if she has worsening respiratory symptoms, fevers, chills, nausea, vomiting, abdominal pain. Both she and her husband who were admitted for COVID-19 infection are being discharged home today with instructions for self quarantine.   Essential hypertension Stable.  Continue home meds.   Acute kidney injury Mild.    Possibly prerenal with dehydration resolved with IV fluids.    Family Communication  : Discussed with patient at bedside.  Informed husband who is admitted as well.  Disposition Plan  :  Stable to be discharged home.     Consults  : None  Procedures  : None Discharge Instructions   Allergies as of 10/04/2018   No Known Allergies     Medication List    STOP taking these medications   oxyCODONE-acetaminophen 5-325 MG tablet Commonly known as: PERCOCET/ROXICET     TAKE  these medications   amLODipine 5 MG tablet Commonly known as: NORVASC Take 5 mg by mouth daily.   amoxicillin-clavulanate 875-125 MG tablet Commonly known as: Augmentin Take 1 tablet by mouth 2 (two) times daily for 4  days.   aspirin EC 81 MG tablet Take 81 mg by mouth daily.   feeding supplement (ENSURE ENLIVE) Liqd Take 237 mLs by mouth 2 (two) times daily between meals.   levothyroxine 100 MCG tablet Commonly known as: SYNTHROID Take 1 tablet (100 mcg total) by mouth daily before breakfast.   metoprolol succinate 100 MG 24 hr tablet Commonly known as: TOPROL-XL Take 100 mg by mouth daily. Take with or immediately following a meal.   multivitamin tablet Take 1 tablet by mouth daily.   predniSONE 20 MG tablet Commonly known as: DELTASONE Take 1 tablet (20 mg total) by mouth daily with breakfast.   rosuvastatin 20 MG tablet Commonly known as: CRESTOR Take 20 mg by mouth every evening.   Vicks DayQuil Cold & Flu 10-5-325 MG/15ML Liqd Generic drug: DM-Phenylephrine-Acetaminophen Take 15 mLs by mouth every 12 (twelve) hours as needed (cold symptoms).      Follow-up Information    Nolene Ebbs, MD. Schedule an appointment as soon as possible for a visit in 2 week(s).   Specialty: Internal Medicine Contact information: [place] [address] [phone]          No Known Allergies      Procedures/Studies: Dg Chest Port 1 View  Result Date: 10/02/2018 CLINICAL DATA:  Shortness of breath, fever EXAM: PORTABLE CHEST 1 VIEW COMPARISON:  05/28/2014 FINDINGS: Cardiac silhouette appears mildly enlarged. Aorta is calcified and tortuous. Patchy airspace opacity in the left lung base. Right lung appears clear. No pleural effusion or pneumothorax identified. Multiple surgical clips within the left neck. IMPRESSION: Patchy left lower lobe airspace opacity suspicious for pneumonia. Electronically Signed   By: Davina Poke M.D.   On: 10/02/2018 16:27       Subjective: Feels much better and at baseline.  Remains stable on room air.  No cough or shortness of breath.  Discharge Exam: Vitals:   10/03/18 2000 10/04/18 0800  BP: (!) 141/75 128/64  Pulse: 80 90   Resp: 18   Temp: 97.7 F (36.5 C) 97.8 F (36.6 C)  SpO2: 98% 100%   Vitals:   10/03/18 1045 10/03/18 1200 10/03/18 2000 10/04/18 0800  BP: (!) 161/75 (!) 138/115 (!) 141/75 128/64  Pulse: 82 82 80 90  Resp: (!) 24 20 18    Temp:  98.2 F (36.8 C) 97.7 F (36.5 C) 97.8 F (36.6 C)  TempSrc:  Axillary Oral Oral  SpO2: 99% 100% 98% 100%  Weight:      Height:        General: Elderly female not in distress HEENT: Moist mucosa, supple neck Chest: Clear bilaterally CVs: Normal S1-S2 GI: Soft, nondistended, nontender Musculoskeletal: Warm, no edema      The results of significant diagnostics from this hospitalization (including imaging, microbiology, ancillary and laboratory) are listed below for reference.     Microbiology: Recent Results (from the past 240 hour(s))  SARS Coronavirus 2 (CEPHEID- Performed in [place] hospital lab), Hosp Order     Status: Abnormal   Collection Time: 10/02/18  3:57 PM   Specimen: Nasopharyngeal Swab  Result Value Ref Range Status   SARS Coronavirus 2 POSITIVE (A) NEGATIVE Final    Comment: RESULT CALLED TO, READ BACK BY AND VERIFIED WITH: H HARDY RN  10/02/18 1729 JDW (NOTE) If result is NEGATIVE SARS-CoV-2 target nucleic acids are NOT DETECTED. The SARS-CoV-2 RNA is generally detectable in upper and lower  respiratory specimens during the acute phase of infection. The lowest  concentration of SARS-CoV-2 viral copies this assay can detect is 250  copies / mL. A negative result does not preclude SARS-CoV-2 infection  and should not be used as the sole basis for treatment or other  patient management decisions.  A negative result may occur with  improper specimen collection / handling, submission of specimen other  than nasopharyngeal swab, presence of viral mutation(s) within the  areas targeted by this assay, and inadequate number of viral copies  (<250 copies / mL). A negative result must be combined with clinical  observations,  patient history, and epidemiological information. If result is POSITIVE SARS-CoV-2 target nucleic acids are DETECTED. The SARS- CoV-2 RNA is generally detectable in upper and lower  respiratory specimens during the acute phase of infection.  Positive  results are indicative of active infection with SARS-CoV-2.  Clinical  correlation with patient history and other diagnostic information is  necessary to determine patient infection status.  Positive results do  not rule out bacterial infection or co-infection with other viruses. If result is PRESUMPTIVE POSTIVE SARS-CoV-2 nucleic acids MAY BE PRESENT.   A presumptive positive result was obtained on the submitted specimen  and confirmed on repeat testing.  While 2019 novel coronavirus  (SARS-CoV-2) nucleic acids may be present in the submitted sample  additional confirmatory testing may be necessary for epidemiological  and / or clinical management purposes  to differentiate between  SARS-CoV-2 and other Sarbecovirus currently known to infect humans.  If clinically indicated additional testing with an alternate test  methodology [phone]) is advised . The SARS-CoV-2 RNA is generally  detectable in upper and lower respiratory specimens during the acute  phase of infection. The expected result is Negative. Fact Sheet for Patients:  StrictlyIdeas.no Fact Sheet for Healthcare Providers: BankingDealers.co.za This test is not yet approved or cleared by the [place] FDA and has been authorized for detection and/or diagnosis of SARS-CoV-2 by FDA under an Emergency Use Authorization (EUA).  This EUA will remain in effect (meaning this test can be used) for the duration of the COVID-19 declaration under Section 564(b)(1) of the Act, 21 U.S.C. section 360bbb-3(b)(1), unless the authorization is terminated or revoked sooner. Performed at [facility], [place] [address] [place],  [place] [address]      Labs: BNP (last 3 results) No results for input(s): BNP in the last 8760 hours. Basic Metabolic Panel: Recent Labs  Lab 10/02/18 1534 10/03/18 0927  NA 140  --   K 3.7  --   CL 102  --   CO2 24  --   GLUCOSE 87  --   BUN 36*  --   CREATININE 1.09* 0.72  CALCIUM 9.0  --    Liver Function Tests: No results for input(s): AST, ALT, ALKPHOS, BILITOT, PROT, ALBUMIN in the last 168 hours. No results for input(s): LIPASE, AMYLASE in the last 168 hours. No results for input(s): AMMONIA in the last 168 hours. CBC: Recent Labs  Lab 10/02/18 1534 10/03/18 0927  WBC 4.7 4.3  NEUTROABS 3.7  --   HGB 15.3* 14.7  HCT 47.2* 46.8*  MCV 82.7 85.1  PLT PLATELET CLUMPS NOTED ON SMEAR, UNABLE TO ESTIMATE 149*   Cardiac Enzymes: Recent Labs  Lab 10/03/18 0927  CKTOTAL 110   BNP:  Invalid input(s): POCBNP CBG: No results for input(s): GLUCAP in the last 168 hours. D-Dimer Recent Labs    10/04/18 0415  DDIMER 0.67*   Hgb A1c No results for input(s): HGBA1C in the last 72 hours. Lipid Profile Recent Labs    10/04/18 0415  TRIG 69   Thyroid function studies Recent Labs    10/02/18 1640  TSH 0.528   Anemia work up Recent Labs    10/03/18 0927 10/04/18 0415  FERRITIN 683* 571*   Urinalysis    Component Value Date/Time   COLORURINE YELLOW 10/02/2018 1534   APPEARANCEUR HAZY (A) 10/02/2018 1534   LABSPEC 1.016 10/02/2018 1534   PHURINE 5.0 10/02/2018 1534   GLUCOSEU NEGATIVE 10/02/2018 1534   HGBUR MODERATE (A) 10/02/2018 1534   BILIRUBINUR NEGATIVE 10/02/2018 1534   KETONESUR 5 (A) 10/02/2018 1534   PROTEINUR NEGATIVE 10/02/2018 1534   NITRITE NEGATIVE 10/02/2018 1534   LEUKOCYTESUR NEGATIVE 10/02/2018 1534   Sepsis Labs Invalid input(s): PROCALCITONIN,  WBC,  LACTICIDVEN Microbiology Recent Results (from the past 240 hour(s))  SARS Coronavirus 2 (CEPHEID- Performed in [place] hospital lab), Hosp Order     Status: Abnormal   Collection  Time: 10/02/18  3:57 PM   Specimen: Nasopharyngeal Swab  Result Value Ref Range Status   SARS Coronavirus 2 POSITIVE (A) NEGATIVE Final    Comment: RESULT CALLED TO, READ BACK BY AND VERIFIED WITH: H HARDY RN 10/02/18 1729 JDW (NOTE) If result is NEGATIVE SARS-CoV-2 target nucleic acids are NOT DETECTED. The SARS-CoV-2 RNA is generally detectable in upper and lower  respiratory specimens during the acute phase of infection. The lowest  concentration of SARS-CoV-2 viral copies this assay can detect is 250  copies / mL. A negative result does not preclude SARS-CoV-2 infection  and should not be used as the sole basis for treatment or other  patient management decisions.  A negative result may occur with  improper specimen collection / handling, submission of specimen other  than nasopharyngeal swab, presence of viral mutation(s) within the  areas targeted by this assay, and inadequate number of viral copies  (<250 copies / mL). A negative result must be combined with clinical  observations, patient history, and epidemiological information. If result is POSITIVE SARS-CoV-2 target nucleic acids are DETECTED. The SARS- CoV-2 RNA is generally detectable in upper and lower  respiratory specimens during the acute phase of infection.  Positive  results are indicative of active infection with SARS-CoV-2.  Clinical  correlation with patient history and other diagnostic information is  necessary to determine patient infection status.  Positive results do  not rule out bacterial infection or co-infection with other viruses. If result is PRESUMPTIVE POSTIVE SARS-CoV-2 nucleic acids MAY BE PRESENT.   A presumptive positive result was obtained on the submitted specimen  and confirmed on repeat testing.  While 2019 novel coronavirus  (SARS-CoV-2) nucleic acids may be present in the submitted sample  additional confirmatory testing may be necessary for epidemiological  and / or clinical management  purposes  to differentiate between  SARS-CoV-2 and other Sarbecovirus currently known to infect humans.  If clinically indicated additional testing with an alternate test  methodology [phone]) is advised . The SARS-CoV-2 RNA is generally  detectable in upper and lower respiratory specimens during the acute  phase of infection. The expected result is Negative. Fact Sheet for Patients:  StrictlyIdeas.no Fact Sheet for Healthcare Providers: BankingDealers.co.za This test is not yet approved or cleared by the [place] FDA and  has been authorized for detection and/or diagnosis of SARS-CoV-2 by FDA under an Emergency Use Authorization (EUA).  This EUA will remain in effect (meaning this test can be used) for the duration of the COVID-19 declaration under Section 564(b)(1) of the Act, 21 U.S.C. section 360bbb-3(b)(1), unless the authorization is terminated or revoked sooner. Performed at [facility], [place] [address] [place],  [address]      Time coordinating discharge: 35 minutes  SIGNED:   Louellen Molder, MD  [facility] 10/04/2018, 9:46 AM Pager   If 7PM-7AM, please contact night-coverage [email or web address] Password [email or web address]

## 2018-10-04 NOTE — Progress Notes (Signed)
SATURATION QUALIFICATIONS: (This note is used to comply with regulatory documentation for home oxygen)  Patient Saturations on Room Air at Rest = 98%  Patient Saturations on Room Air while Ambulating = 94%  Patient Saturations on  Liters of oxygen while Ambulating = %  Please briefly explain why patient needs home oxygen: 

## 2018-10-06 LAB — INTERLEUKIN-6, PLASMA: Interleukin-6, Plasma: 4.9 pg/mL (ref 0.0–12.2)

## 2019-03-28 IMAGING — US US SOFT TISSUE HEAD/NECK
1 series · 14 of 14 positions shown · non-contrast
Comparison: 09/12/2015

CLINICAL DATA: 75-year-old female with a history of total
thyroidectomy

EXAM:
THYROID ULTRASOUND
TECHNIQUE: Ultrasound examination of the thyroid gland and adjacent soft
tissues was performed.

[Series 1: us soft tissue head/neck · 0.04mm/px · 14 of 14 slices shown]
[im 1/14]
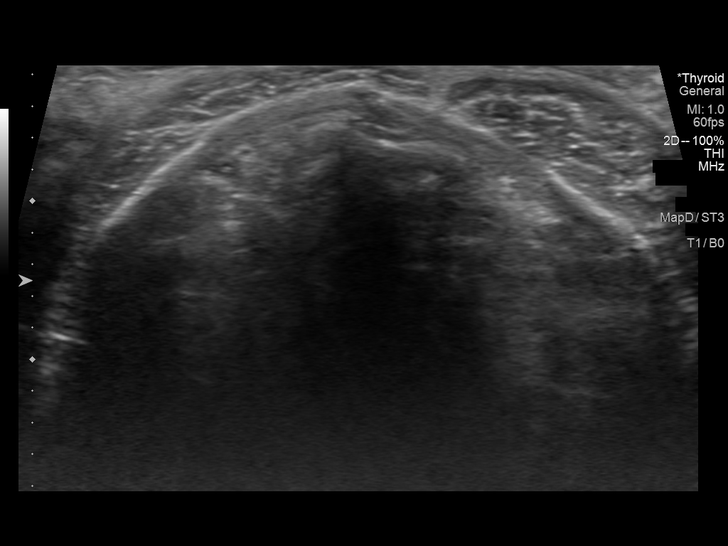
[im 2/14]
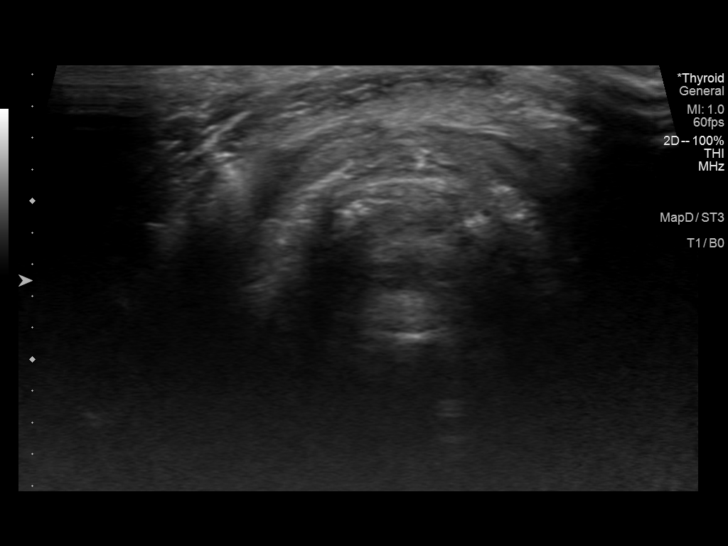
[im 3/14]
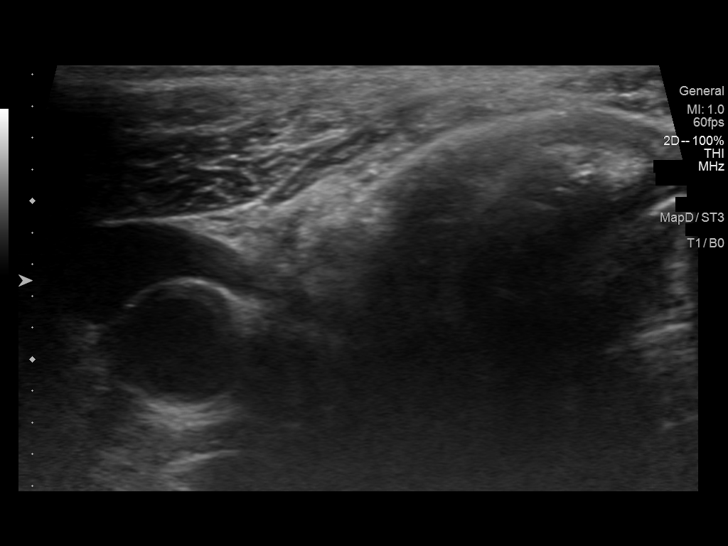
[im 4/14]
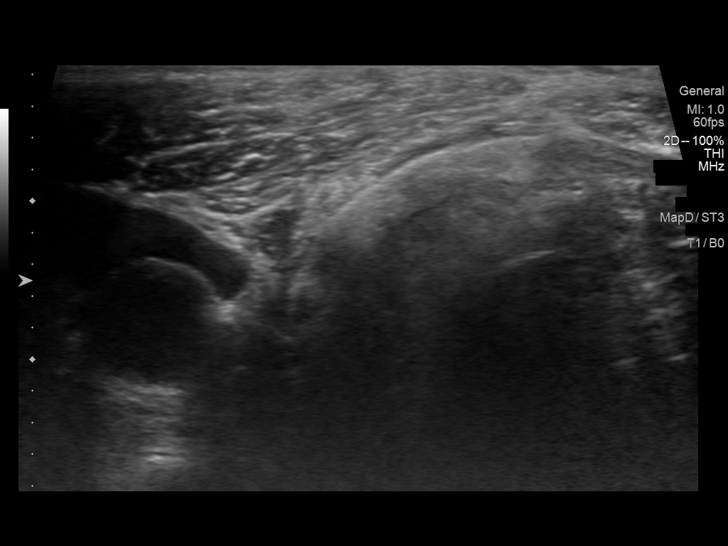
[im 5/14]
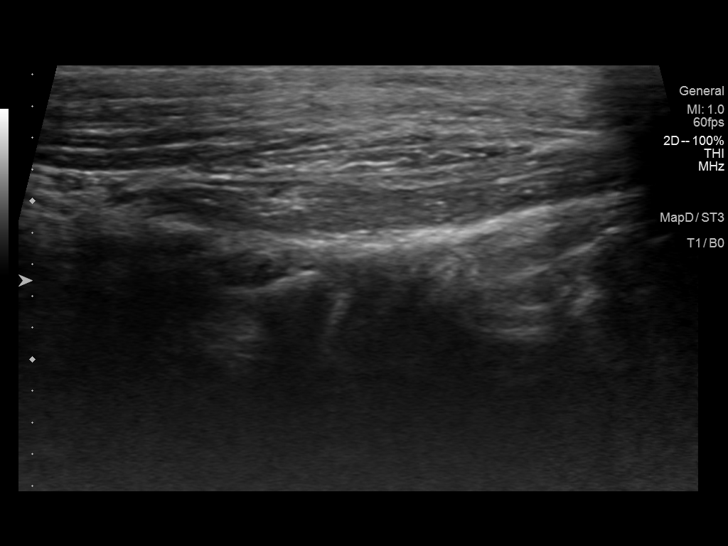
[im 6/14]
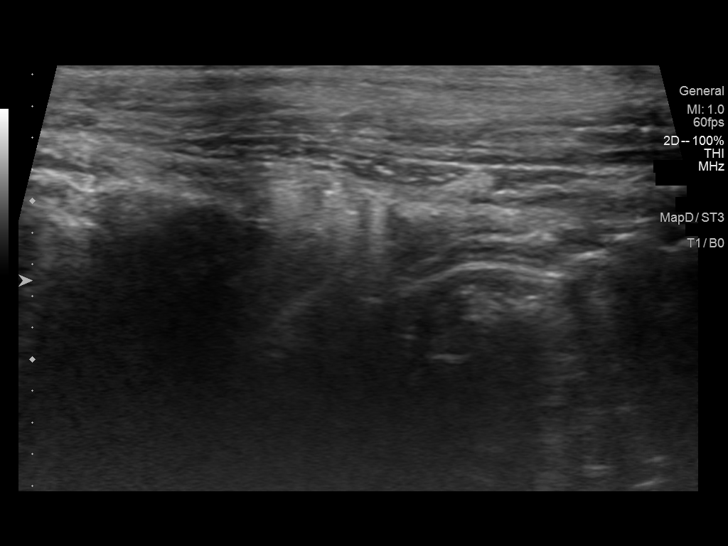
[im 7/14]
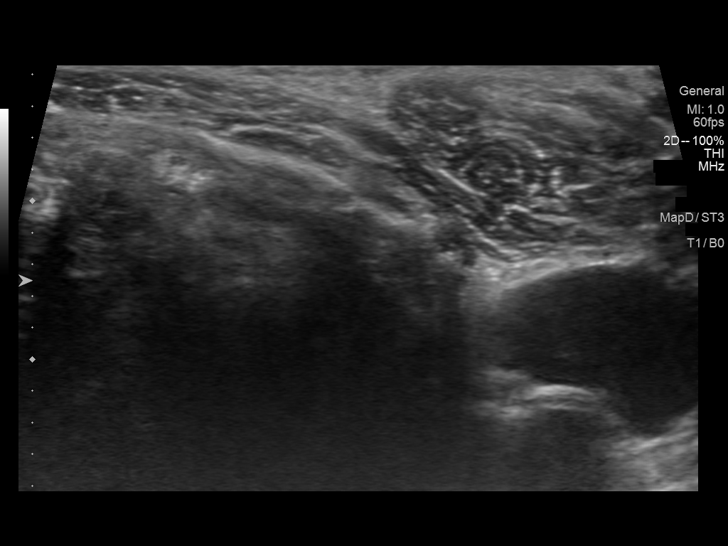
[im 8/14]
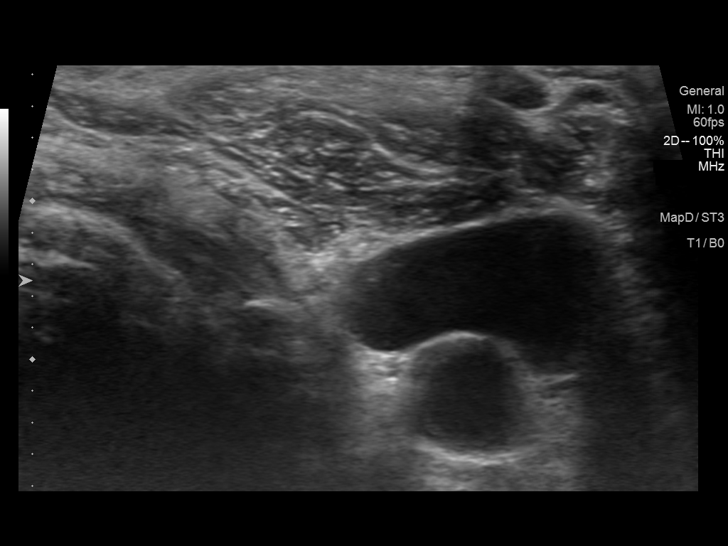
[im 9/14]
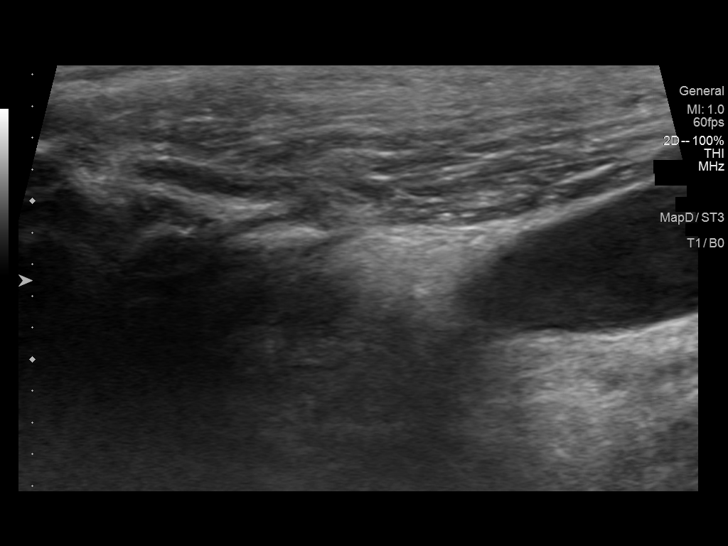
[im 10/14]
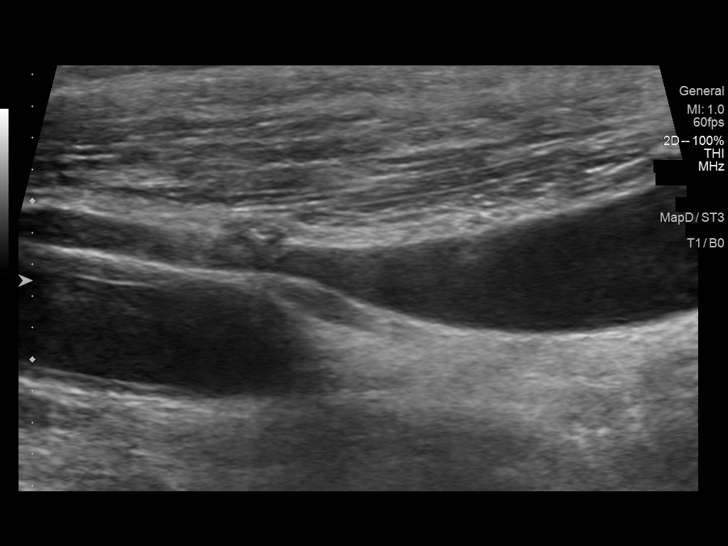
[im 11/14]
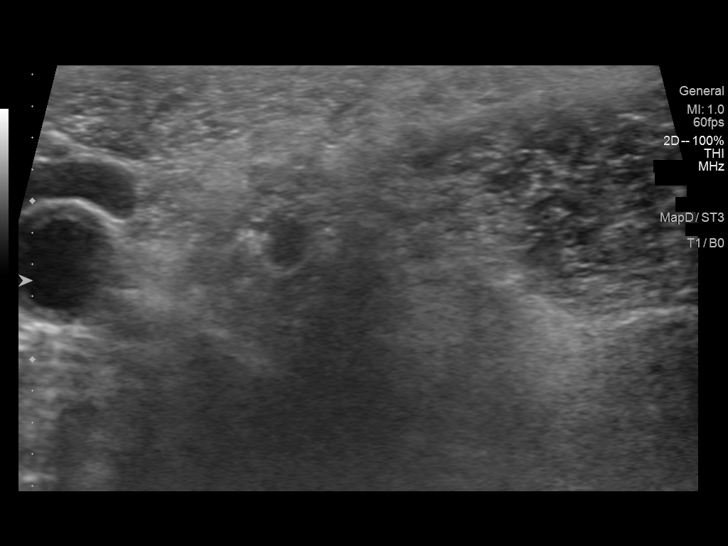
[im 12/14]
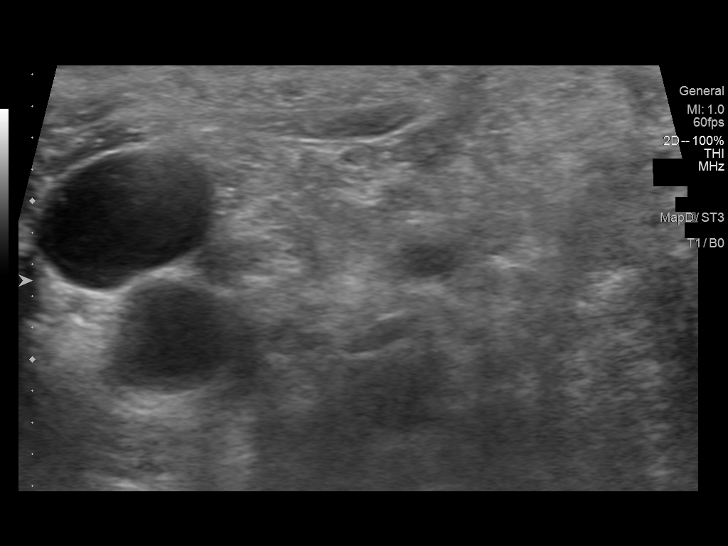
[im 13/14]
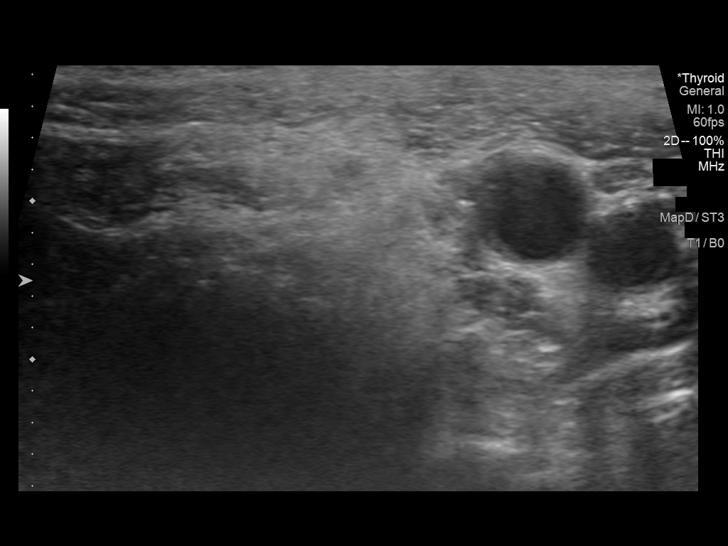
[im 14/14]
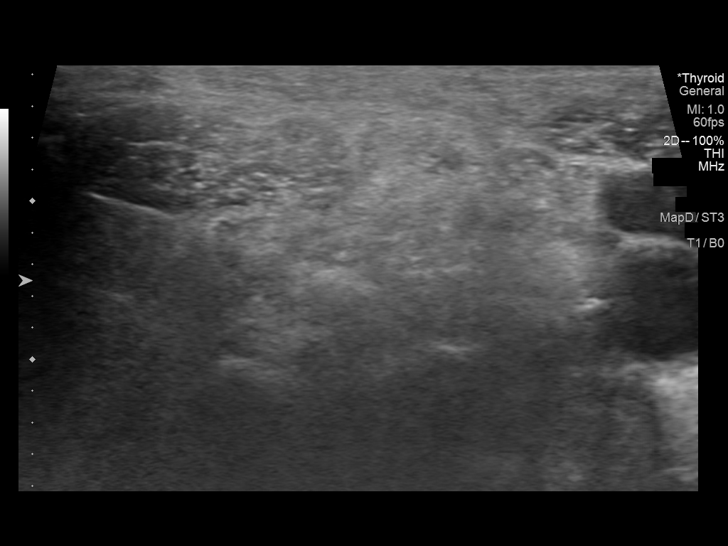

[14 of 14 positions shown; findings below may reference images not displayed]

FINDINGS: No residual thyroid tissue in the left or right thyroid bed.

No adenopathy
IMPRESSION: Changes of thyroidectomy with no residual thyroid tissue or
adenopathy.

## 2019-12-18 ENCOUNTER — Other Ambulatory Visit: Payer: Self-pay | Admitting: Internal Medicine

## 2019-12-18 DIAGNOSIS — E2839 Other primary ovarian failure: Secondary | ICD-10-CM

## 2019-12-24 ENCOUNTER — Other Ambulatory Visit: Payer: Self-pay

## 2019-12-28 ENCOUNTER — Ambulatory Visit
Admission: RE | Admit: 2019-12-28 | Discharge: 2019-12-28 | Disposition: A | Discharge status: Home / Self Care | Payer: Medicare Other | Source: Ambulatory Visit | Attending: Internal Medicine | Admitting: Internal Medicine

## 2019-12-28 ENCOUNTER — Other Ambulatory Visit: Payer: Self-pay

## 2019-12-28 DIAGNOSIS — E2839 Other primary ovarian failure: Secondary | ICD-10-CM

## 2020-12-01 ENCOUNTER — Other Ambulatory Visit: Payer: Self-pay | Admitting: Internal Medicine

## 2020-12-02 LAB — CBC
HCT: 45.5 % — ABNORMAL HIGH (ref 35.0–45.0)
Hemoglobin: 14.4 g/dL (ref 11.7–15.5)
MCH: 27.5 pg (ref 27.0–33.0)
MCHC: 31.6 g/dL — ABNORMAL LOW (ref 32.0–36.0)
MCV: 86.8 fL (ref 80.0–100.0)
MPV: 10.4 fL (ref 7.5–12.5)
Platelets: 221 10*3/uL (ref 140–400)
RBC: 5.24 10*6/uL — ABNORMAL HIGH (ref 3.80–5.10)
RDW: 13.8 % (ref 11.0–15.0)
WBC: 4.2 10*3/uL (ref 3.8–10.8)

## 2020-12-02 LAB — LIPID PANEL
Cholesterol: 286 mg/dL — ABNORMAL HIGH (ref <200)
HDL: 108 mg/dL (ref >=50)
LDL Cholesterol (Calc): 152 mg/dL (calc) — ABNORMAL HIGH
Non-HDL Cholesterol (Calc): 178 mg/dL (calc) — ABNORMAL HIGH (ref <130)
Total CHOL/HDL Ratio: 2.6 (calc) (ref <5.0)
Triglycerides: 140 mg/dL (ref <150)

## 2020-12-02 LAB — COMPLETE METABOLIC PANEL WITH GFR
AG Ratio: 1.4 (calc) (ref 1.0–2.5)
ALT: 10 U/L (ref 6–29)
AST: 24 U/L (ref 10–35)
Albumin: 4.3 g/dL (ref 3.6–5.1)
Alkaline phosphatase (APISO): 35 U/L — ABNORMAL LOW (ref 37–153)
BUN: 13 mg/dL (ref 7–25)
CO2: 28 mmol/L (ref 20–32)
Calcium: 9.6 mg/dL (ref 8.6–10.4)
Chloride: 98 mmol/L (ref 98–110)
Creat: 0.87 mg/dL (ref 0.60–0.95)
Globulin: 3.1 g/dL (calc) (ref 1.9–3.7)
Glucose, Bld: 113 mg/dL — ABNORMAL HIGH (ref 65–99)
Potassium: 3.8 mmol/L (ref 3.5–5.3)
Sodium: 139 mmol/L (ref 135–146)
Total Bilirubin: 0.5 mg/dL (ref 0.2–1.2)
Total Protein: 7.4 g/dL (ref 6.1–8.1)
eGFR: 67 mL/min/{1.73_m2} (ref >=60)

## 2020-12-02 LAB — T4, FREE: Free T4: 0.2 ng/dL — ABNORMAL LOW (ref 0.8–1.8)

## 2020-12-02 LAB — TSH: TSH: 104.32 mIU/L — ABNORMAL HIGH (ref 0.40–4.50)

## 2021-04-03 ENCOUNTER — Ambulatory Visit: Payer: Medicare HMO | Admitting: Podiatry

## 2021-04-03 ENCOUNTER — Ambulatory Visit (INDEPENDENT_AMBULATORY_CARE_PROVIDER_SITE_OTHER): Payer: Self-pay | Admitting: Podiatry

## 2021-04-03 DIAGNOSIS — Z91199 Patient's noncompliance with other medical treatment and regimen due to unspecified reason: Secondary | ICD-10-CM

## 2021-04-03 NOTE — Progress Notes (Signed)
No show

## 2021-04-10 IMAGING — DX PORTABLE CHEST - 1 VIEW
1 series · 1 of 1 positions shown · non-contrast
Comparison: 05/28/2014

CLINICAL DATA: Shortness of breath, fever

EXAM:
PORTABLE CHEST 1 VIEW

[chest]
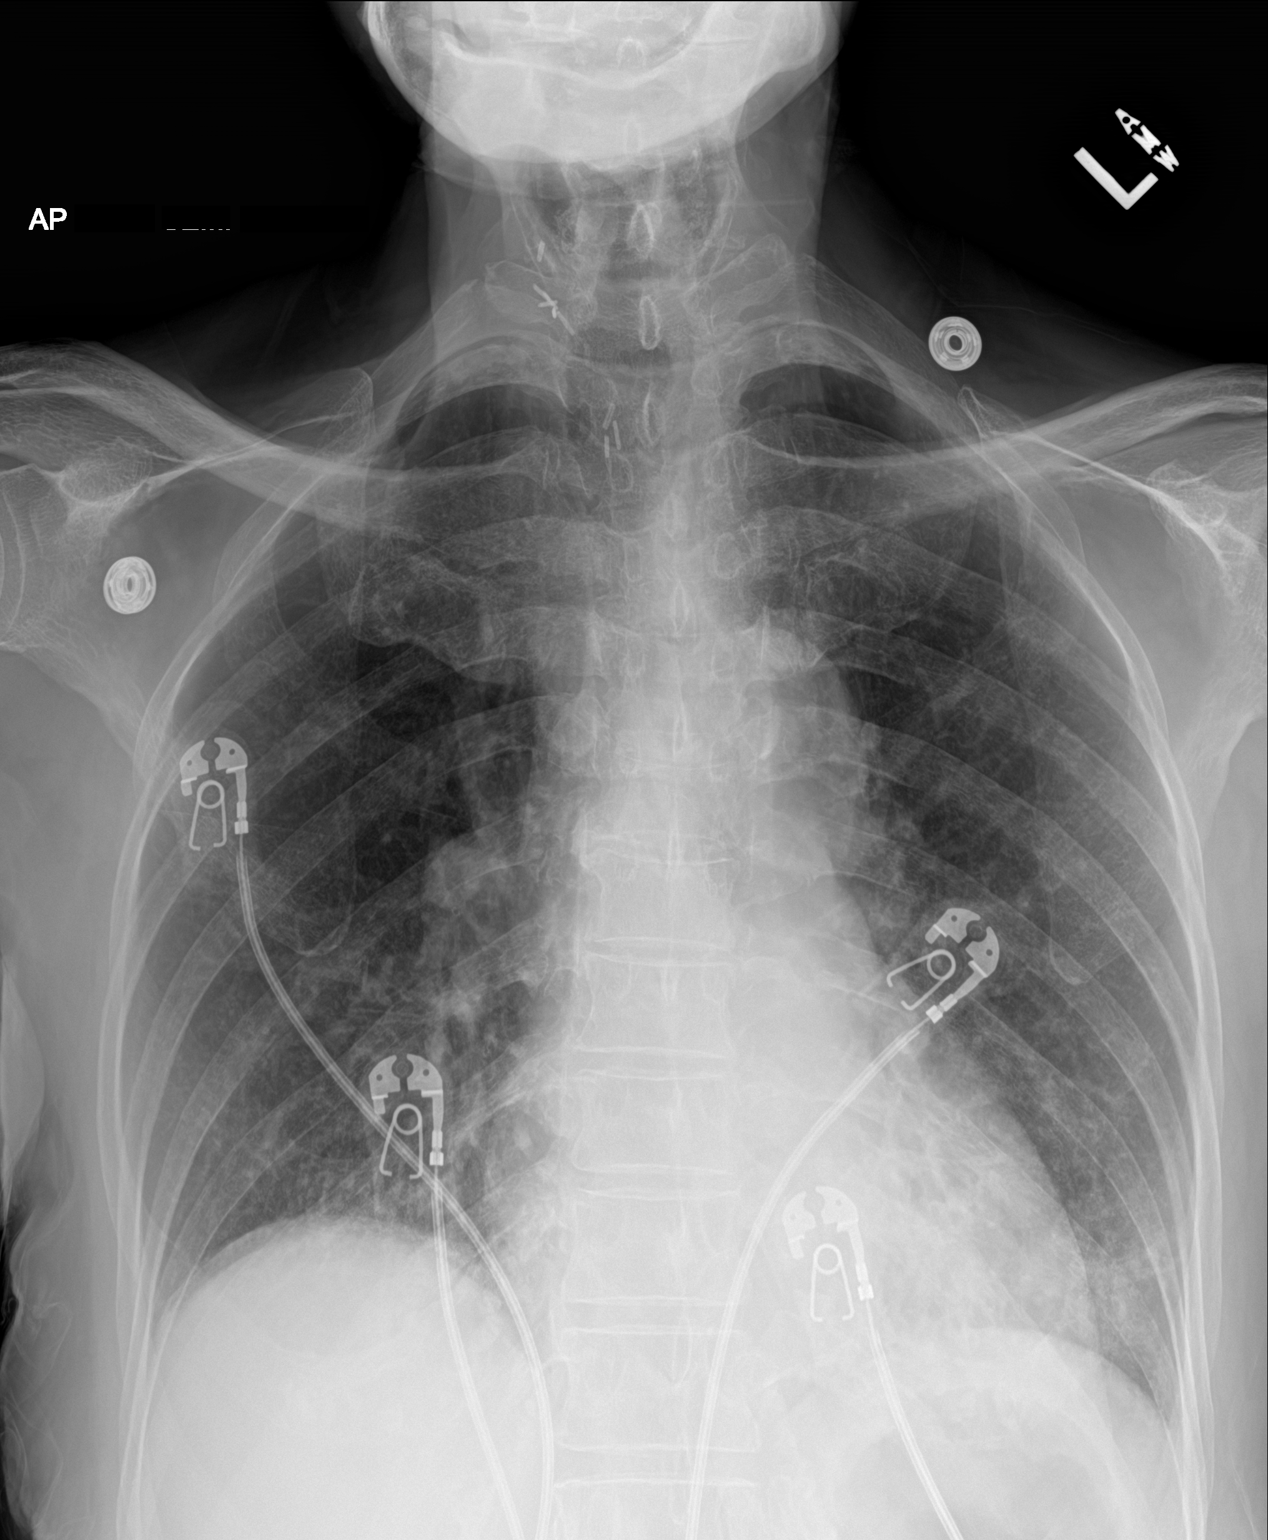

[1 of 1 positions shown; findings below may reference images not displayed]

FINDINGS: Cardiac silhouette appears mildly enlarged. Aorta is calcified and
tortuous. Patchy airspace opacity in the left lung base. Right lung
appears clear. No pleural effusion or pneumothorax identified.
Multiple surgical clips within the left neck.
IMPRESSION: Patchy left lower lobe airspace opacity suspicious for pneumonia.

## 2021-04-14 ENCOUNTER — Other Ambulatory Visit: Payer: Self-pay

## 2021-04-14 ENCOUNTER — Encounter: Payer: Self-pay | Admitting: Podiatry

## 2021-04-14 ENCOUNTER — Ambulatory Visit (INDEPENDENT_AMBULATORY_CARE_PROVIDER_SITE_OTHER): Payer: Medicare HMO | Admitting: Podiatry

## 2021-04-14 DIAGNOSIS — M79675 Pain in left toe(s): Secondary | ICD-10-CM | POA: Diagnosis not present

## 2021-04-14 DIAGNOSIS — M79674 Pain in right toe(s): Secondary | ICD-10-CM

## 2021-04-14 DIAGNOSIS — B351 Tinea unguium: Secondary | ICD-10-CM | POA: Diagnosis not present

## 2021-04-14 NOTE — Progress Notes (Signed)
°  Subjective:  Patient ID: [name], female    DOB: 1940/05/23,  MRN: [id number]  Chief Complaint  Patient presents with   Nail Problem    Nail trim    81 y.o. female returns for the above complaint.  Patient presents with thickened elongated dystrophic toenails x10.  Pain on palpation.  She states that she is not able to do it herself.  She would like for me to do for them.  She is not a diabetic.  She denies seeing anyone else prior to seeing the progress  Objective:  There were no vitals filed for this visit. Podiatric Exam: Vascular: dorsalis pedis and posterior tibial pulses are palpable bilateral. Capillary return is immediate. Temperature gradient is WNL. Skin turgor WNL  Sensorium: Normal Semmes Weinstein monofilament test. Normal tactile sensation bilaterally. Nail Exam: Pt has thick disfigured discolored nails with subungual debris noted bilateral entire nail hallux through fifth toenails.  Pain on palpation to the nails. Ulcer Exam: There is no evidence of ulcer or pre-ulcerative changes or infection. Orthopedic Exam: Muscle tone and strength are WNL. No limitations in general ROM. No crepitus or effusions noted.  Skin: No Porokeratosis. No infection or ulcers    Assessment & Plan:   1. Pain due to onychomycosis of toenails of both feet     Patient was evaluated and treated and all questions answered.  Onychomycosis with pain  -Nails palliatively debrided as below. -Educated on self-care  Procedure: Nail Debridement Rationale: pain  Type of Debridement: manual, sharp debridement. Instrumentation: Nail nipper, rotary burr. Number of Nails: 10  Procedures and Treatment: Consent by patient was obtained for treatment procedures. The patient understood the discussion of treatment and procedures well. All questions were answered thoroughly reviewed. Debridement of mycotic and hypertrophic toenails, 1 through 5 bilateral and clearing of subungual debris. No  ulceration, no infection noted.  Return Visit-Office Procedure: Patient instructed to return to the office for a follow up visit 3 months for continued evaluation and treatment.  Boneta Lucks, DPM    No follow-ups on file.

## 2021-07-14 ENCOUNTER — Ambulatory Visit: Payer: Medicare HMO | Admitting: Podiatry

## 2021-07-14 ENCOUNTER — Encounter: Payer: Self-pay | Admitting: Podiatry

## 2021-07-14 DIAGNOSIS — I739 Peripheral vascular disease, unspecified: Secondary | ICD-10-CM

## 2021-07-14 DIAGNOSIS — M79674 Pain in right toe(s): Secondary | ICD-10-CM

## 2021-07-14 DIAGNOSIS — Q828 Other specified congenital malformations of skin: Secondary | ICD-10-CM | POA: Diagnosis not present

## 2021-07-14 DIAGNOSIS — R0989 Other specified symptoms and signs involving the circulatory and respiratory systems: Secondary | ICD-10-CM | POA: Diagnosis not present

## 2021-07-14 DIAGNOSIS — M79675 Pain in left toe(s): Secondary | ICD-10-CM

## 2021-07-14 DIAGNOSIS — B351 Tinea unguium: Secondary | ICD-10-CM | POA: Diagnosis not present

## 2021-07-17 ENCOUNTER — Telehealth: Payer: Self-pay | Admitting: *Deleted

## 2021-07-17 NOTE — Telephone Encounter (Signed)
Prior authorization is required for the V&V study,please contact Damita Dunnings to schedule appointment w/[facility] once authorization has been completed '@[phone]. ?

## 2021-07-17 NOTE — Telephone Encounter (Signed)
Prior authorization is needed for this study(Vas. [place] ABI w/w/o TBI), ref# [id number]: Kit D,Duncan w/ V&V has been notified to schedule patient. ?

## 2021-07-20 ENCOUNTER — Ambulatory Visit (HOSPITAL_COMMUNITY)
Admission: RE | Admit: 2021-07-20 | Discharge: 2021-07-20 | Disposition: A | Discharge status: Home / Self Care | Payer: Self-pay | Source: Ambulatory Visit | Attending: Podiatry | Admitting: Podiatry

## 2021-07-20 DIAGNOSIS — R0989 Other specified symptoms and signs involving the circulatory and respiratory systems: Secondary | ICD-10-CM

## 2021-07-21 ENCOUNTER — Telehealth: Payer: Self-pay | Admitting: Podiatry

## 2021-07-21 ENCOUNTER — Telehealth (INDEPENDENT_AMBULATORY_CARE_PROVIDER_SITE_OTHER): Payer: Medicare HMO | Admitting: Podiatry

## 2021-07-21 NOTE — Telephone Encounter (Signed)
LVM with ABI results. ABI readings normal with some mild compromise of toe pressures. Keep her  regularly scheduled appointment with me. Patient may call back with any questions. ?

## 2021-07-24 NOTE — Progress Notes (Signed)
?  Subjective:  ?Patient ID: [name], female    DOB: 1940-04-10,  MRN: [id number] ? ?[name] presents to clinic today for painful elongated mycotic toenails 1-5 bilaterally which are tender when wearing enclosed shoe gear. Pain is relieved with periodic professional debridement. ? ?New problem(s): None.  ? ?PCP is Nolene Ebbs, MD , and last visit was last month. ? ?Allergies  ?Allergen Reactions  ? Codeine   ?  Other reaction(s): Unknown  ? ? ?Review of Systems: Negative except as noted in the HPI. ? ?Objective: No changes noted in today's physical examination. ?Vascular Examination: ?CFT <4 seconds b/l. DP pulses diminished b/l. Unable to palpate PT pulses b/l. Digital hair absent. Skin temperature gradient warm to cool b/l. No ischemia or gangrene. No cyanosis or clubbing noted b/l.   ? ?Neurological Examination: ?Sensation grossly intact b/l with 10 gram monofilament. Vibratory sensation intact b/l.  ? ?Dermatological Examination: ?No open wounds b/l LE. No interdigital macerations noted b/l LE. Toenails 1-5 b/l elongated, discolored, dystrophic, thickened, crumbly with subungual debris and tenderness to dorsal palpation. Skin is noted to be bound down and leathery b/l. Healed bunion scar noted LLE. Porokeratotic lesion(s) right great toe, submet head 1 left foot, submet head 3 right foot, and submet head 5 left foot. No erythema, no edema, no drainage, no fluctuance. ? ?Musculoskeletal Examination: ?Muscle strength 5/5 to b/l LE. No pain, crepitus or joint limitation noted with ROM bilateral LE. Hammertoe deformity noted 2-5 b/l. ? ?Radiographs: None ? ?Assessment/Plan: ?1. Pain due to onychomycosis of toenails of both feet   ?2. Diminished pulses in lower extremity   ?3. Porokeratosis   ?4. PAD (peripheral artery disease) ([place])   ?  ?-Patient was evaluated and treated. All patient's and/or POA's questions/concerns answered on today's visit. ?-Ordered noninvasive arterial studies ABIs with  and without TBIs for b/l lower extremities. ?-Mycotic toenails 1-5 bilaterally were debrided in length and girth with sterile nail nippers and dremel without incident. ?-Porokeratotic lesion(s) R 2nd toe, submet head 1 left foot, submet head 3 right foot, and submet head 5 left foot pared and enucleated with sterile scalpel blade without incident. Total number of lesions debrided=4. ?-Patient/POA to call should there be question/concern in the interim.  ? ?Return in about 3 months (around 10/14/2021). ? ?Marzetta Board, DPM  ?

## 2021-08-16 NOTE — Telephone Encounter (Signed)
Encounter error

## 2021-10-16 ENCOUNTER — Ambulatory Visit (INDEPENDENT_AMBULATORY_CARE_PROVIDER_SITE_OTHER): Payer: Self-pay | Admitting: Podiatry

## 2021-10-16 DIAGNOSIS — Z91199 Patient's noncompliance with other medical treatment and regimen due to unspecified reason: Secondary | ICD-10-CM

## 2021-10-16 NOTE — Progress Notes (Signed)
1. No-show for appointment     

## 2021-12-08 ENCOUNTER — Other Ambulatory Visit: Payer: Self-pay | Admitting: Internal Medicine

## 2021-12-08 DIAGNOSIS — E038 Other specified hypothyroidism: Secondary | ICD-10-CM | POA: Diagnosis not present

## 2021-12-08 DIAGNOSIS — R630 Anorexia: Secondary | ICD-10-CM | POA: Diagnosis not present

## 2021-12-08 DIAGNOSIS — I10 Essential (primary) hypertension: Secondary | ICD-10-CM | POA: Diagnosis not present

## 2021-12-08 DIAGNOSIS — I872 Venous insufficiency (chronic) (peripheral): Secondary | ICD-10-CM | POA: Diagnosis not present

## 2021-12-08 DIAGNOSIS — E7849 Other hyperlipidemia: Secondary | ICD-10-CM | POA: Diagnosis not present

## 2021-12-08 DIAGNOSIS — Z0000 Encounter for general adult medical examination without abnormal findings: Secondary | ICD-10-CM | POA: Diagnosis not present

## 2021-12-08 DIAGNOSIS — Z23 Encounter for immunization: Secondary | ICD-10-CM | POA: Diagnosis not present

## 2021-12-08 DIAGNOSIS — R7303 Prediabetes: Secondary | ICD-10-CM | POA: Diagnosis not present

## 2021-12-09 LAB — CBC
HCT: 40 % (ref 35.0–45.0)
Hemoglobin: 12.8 g/dL (ref 11.7–15.5)
MCH: 28 pg (ref 27.0–33.0)
MCHC: 32 g/dL (ref 32.0–36.0)
MCV: 87.5 fL (ref 80.0–100.0)
MPV: 10 fL (ref 7.5–12.5)
Platelets: 179 10*3/uL (ref 140–400)
RBC: 4.57 10*6/uL (ref 3.80–5.10)
RDW: 12.7 % (ref 11.0–15.0)
WBC: 4.6 10*3/uL (ref 3.8–10.8)

## 2021-12-09 LAB — COMPLETE METABOLIC PANEL WITH GFR
AG Ratio: 1.4 (calc) (ref 1.0–2.5)
ALT: 8 U/L (ref 6–29)
AST: 18 U/L (ref 10–35)
Albumin: 4 g/dL (ref 3.6–5.1)
Alkaline phosphatase (APISO): 31 U/L — ABNORMAL LOW (ref 37–153)
BUN: 13 mg/dL (ref 7–25)
CO2: 24 mmol/L (ref 20–32)
Calcium: 9.6 mg/dL (ref 8.6–10.4)
Chloride: 110 mmol/L (ref 98–110)
Creat: 0.8 mg/dL (ref 0.60–0.95)
Globulin: 2.8 g/dL (calc) (ref 1.9–3.7)
Glucose, Bld: 166 mg/dL — ABNORMAL HIGH (ref 65–99)
Potassium: 3.5 mmol/L (ref 3.5–5.3)
Sodium: 147 mmol/L — ABNORMAL HIGH (ref 135–146)
Total Bilirubin: 0.4 mg/dL (ref 0.2–1.2)
Total Protein: 6.8 g/dL (ref 6.1–8.1)
eGFR: 74 mL/min/{1.73_m2} (ref >=60)

## 2021-12-09 LAB — LIPID PANEL
Cholesterol: 183 mg/dL (ref <200)
HDL: 90 mg/dL (ref >=50)
LDL Cholesterol (Calc): 75 mg/dL (calc)
Non-HDL Cholesterol (Calc): 93 mg/dL (calc) (ref <130)
Total CHOL/HDL Ratio: 2 (calc) (ref <5.0)
Triglycerides: 94 mg/dL (ref <150)

## 2021-12-09 LAB — T4, FREE: Free T4: 1 ng/dL (ref 0.8–1.8)

## 2021-12-09 LAB — TSH: TSH: 1.73 mIU/L (ref 0.40–4.50)

## 2022-02-15 DIAGNOSIS — F02818 Dementia in other diseases classified elsewhere, unspecified severity, with other behavioral disturbance: Secondary | ICD-10-CM | POA: Diagnosis not present

## 2022-02-15 DIAGNOSIS — F03918 Unspecified dementia, unspecified severity, with other behavioral disturbance: Secondary | ICD-10-CM | POA: Diagnosis not present

## 2022-02-15 DIAGNOSIS — G301 Alzheimer's disease with late onset: Secondary | ICD-10-CM | POA: Diagnosis not present

## 2022-02-16 DIAGNOSIS — R630 Anorexia: Secondary | ICD-10-CM | POA: Diagnosis not present

## 2022-02-16 DIAGNOSIS — I10 Essential (primary) hypertension: Secondary | ICD-10-CM | POA: Diagnosis not present

## 2022-03-02 DIAGNOSIS — G301 Alzheimer's disease with late onset: Secondary | ICD-10-CM | POA: Diagnosis not present

## 2022-04-11 DIAGNOSIS — R413 Other amnesia: Secondary | ICD-10-CM | POA: Diagnosis not present

## 2022-05-07 DIAGNOSIS — G301 Alzheimer's disease with late onset: Secondary | ICD-10-CM | POA: Diagnosis not present

## 2022-05-07 DIAGNOSIS — F0284 Dementia in other diseases classified elsewhere, unspecified severity, with anxiety: Secondary | ICD-10-CM | POA: Diagnosis not present

## 2022-12-17 ENCOUNTER — Other Ambulatory Visit: Payer: Self-pay | Admitting: Student

## 2022-12-17 ENCOUNTER — Encounter: Payer: Self-pay | Admitting: Student

## 2022-12-17 DIAGNOSIS — I70203 Unspecified atherosclerosis of native arteries of extremities, bilateral legs: Secondary | ICD-10-CM

## 2022-12-24 ENCOUNTER — Other Ambulatory Visit: Payer: Self-pay | Admitting: Student

## 2022-12-24 DIAGNOSIS — M7989 Other specified soft tissue disorders: Secondary | ICD-10-CM

## 2022-12-24 DIAGNOSIS — I70203 Unspecified atherosclerosis of native arteries of extremities, bilateral legs: Secondary | ICD-10-CM

## 2022-12-28 ENCOUNTER — Ambulatory Visit
Admission: RE | Admit: 2022-12-28 | Discharge: 2022-12-28 | Disposition: A | Discharge status: Home / Self Care | Payer: Medicare HMO | Source: Ambulatory Visit | Attending: Student

## 2022-12-28 ENCOUNTER — Ambulatory Visit
Admission: RE | Admit: 2022-12-28 | Discharge: 2022-12-28 | Disposition: A | Discharge status: Home / Self Care | Payer: Medicare HMO | Source: Ambulatory Visit | Attending: Student | Admitting: Student

## 2022-12-28 DIAGNOSIS — I70203 Unspecified atherosclerosis of native arteries of extremities, bilateral legs: Secondary | ICD-10-CM

## 2022-12-28 DIAGNOSIS — M7989 Other specified soft tissue disorders: Secondary | ICD-10-CM

## 2022-12-29 ENCOUNTER — Encounter (HOSPITAL_COMMUNITY): Payer: Self-pay

## 2022-12-29 ENCOUNTER — Emergency Department (HOSPITAL_COMMUNITY): Payer: Medicare HMO

## 2022-12-29 ENCOUNTER — Other Ambulatory Visit (HOSPITAL_COMMUNITY): Payer: Self-pay

## 2022-12-29 ENCOUNTER — Other Ambulatory Visit: Payer: Self-pay

## 2022-12-29 ENCOUNTER — Emergency Department (HOSPITAL_COMMUNITY)
Admission: EM | Admit: 2022-12-29 | Discharge: 2022-12-29 | Disposition: A | Discharge status: Home / Self Care | Payer: Medicare HMO | Attending: Emergency Medicine | Admitting: Emergency Medicine

## 2022-12-29 DIAGNOSIS — R918 Other nonspecific abnormal finding of lung field: Secondary | ICD-10-CM | POA: Insufficient documentation

## 2022-12-29 DIAGNOSIS — I82411 Acute embolism and thrombosis of right femoral vein: Secondary | ICD-10-CM | POA: Insufficient documentation

## 2022-12-29 DIAGNOSIS — Z7982 Long term (current) use of aspirin: Secondary | ICD-10-CM | POA: Insufficient documentation

## 2022-12-29 DIAGNOSIS — Z7901 Long term (current) use of anticoagulants: Secondary | ICD-10-CM | POA: Diagnosis not present

## 2022-12-29 DIAGNOSIS — R2241 Localized swelling, mass and lump, right lower limb: Secondary | ICD-10-CM | POA: Diagnosis present

## 2022-12-29 LAB — BASIC METABOLIC PANEL
Anion gap: 11 (ref 5–15)
BUN: 13 mg/dL (ref 8–23)
CO2: 22 mmol/L (ref 22–32)
Calcium: 9.5 mg/dL (ref 8.9–10.3)
Chloride: 109 mmol/L (ref 98–111)
Creatinine, Ser: 0.96 mg/dL (ref 0.44–1.00)
GFR, Estimated: 59 mL/min — ABNORMAL LOW (ref >60)
Glucose, Bld: 104 mg/dL — ABNORMAL HIGH (ref 70–99)
Potassium: 3.8 mmol/L (ref 3.5–5.1)
Sodium: 142 mmol/L (ref 135–145)

## 2022-12-29 LAB — CBC WITH DIFFERENTIAL/PLATELET
Abs Immature Granulocytes: 0.04 10*3/uL (ref 0.00–0.07)
Basophils Absolute: 0 10*3/uL (ref 0.0–0.1)
Basophils Relative: 0 %
Eosinophils Absolute: 0.1 10*3/uL (ref 0.0–0.5)
Eosinophils Relative: 1 %
HCT: 39.1 % (ref 36.0–46.0)
Hemoglobin: 12 g/dL (ref 12.0–15.0)
Immature Granulocytes: 1 %
Lymphocytes Relative: 15 %
Lymphs Abs: 1 10*3/uL (ref 0.7–4.0)
MCH: 26.4 pg (ref 26.0–34.0)
MCHC: 30.7 g/dL (ref 30.0–36.0)
MCV: 85.9 fL (ref 80.0–100.0)
Monocytes Absolute: 0.5 10*3/uL (ref 0.1–1.0)
Monocytes Relative: 7 %
Neutro Abs: 5.2 10*3/uL (ref 1.7–7.7)
Neutrophils Relative %: 76 %
Platelets: 269 10*3/uL (ref 150–400)
RBC: 4.55 MIL/uL (ref 3.87–5.11)
RDW: 14.8 % (ref 11.5–15.5)
WBC: 6.9 10*3/uL (ref 4.0–10.5)
nRBC: 0 % (ref 0.0–0.2)

## 2022-12-29 MED ORDER — RIVAROXABAN (XARELTO) VTE STARTER PACK (15 & 20 MG)
ORAL_TABLET | ORAL | 0 refills | Status: DC
  Filled 2022-12-29: qty 51, 30d supply, fill #0

## 2022-12-29 MED ORDER — IOHEXOL 350 MG/ML SOLN
125.0000 mL | Freq: Once | INTRAVENOUS | Status: AC | PRN
  Administered 2022-12-29: 125 mL via INTRAVENOUS

## 2022-12-29 NOTE — ED Notes (Signed)
Transported to CT 

## 2022-12-29 NOTE — ED Triage Notes (Signed)
Pt sent by sent by PCP for a DVT in right leg. Pt had [place] of right leg and it was positive for DVT.

## 2022-12-29 NOTE — ED Provider Notes (Signed)
[facility] [place] EMERGENCY DEPARTMENT AT [facility] Provider Note   CSN: [id number] Arrival date & time: 12/29/22  1021     History  Chief Complaint  Patient presents with   dvt    [name] is a 82 y.o. female.  HPI    82 year old female comes in with chief complaint of leg swelling and DVT diagnosis.  Patient accompanied by daughter.  Daughter indicates that patient has had leg swelling for at least 1 month.  The swelling has worsened over time.  They saw providers at [facility] who did an ultrasound DVT and then called the patient to advise her to come to the ER.  Patient unsure about the DVT diagnosis.  Of note, patient indicates there is no history of previous DVT, PE.  Patient does not carry an active cancer diagnosis and denies any recent surgeries.  Daughter and the patient indicated that she does have some shortness of breath with exertion, she feels like she has to catch breath while finishing tasks.  Home Medications Prior to Admission medications   Medication Sig Start Date End Date Taking? Authorizing Provider  RIVAROXABAN Carlena Hurl) VTE STARTER PACK (15 & 20 MG) Follow package directions: Take one 15mg  tablet by mouth twice a day. On day 22, switch to one 20mg  tablet once a day. Take with food. 12/29/22  Yes Shineka Auble, MD  amLODipine (NORVASC) 5 MG tablet Take 5 mg by mouth daily.    [provider]  aspirin EC 81 MG tablet Take 81 mg by mouth daily.    [provider]  DM-Phenylephrine-Acetaminophen (VICKS DAYQUIL COLD & FLU) 10-5-325 MG/15ML LIQD Take 15 mLs by mouth every 12 (twelve) hours as needed (cold symptoms).    [provider]  feeding supplement, ENSURE ENLIVE, (ENSURE ENLIVE) LIQD Take 237 mLs by mouth 2 (two) times daily between meals. 10/04/18   Dhungel, Theda Belfast, MD  levothyroxine (SYNTHROID, LEVOTHROID) 100 MCG tablet Take 1 tablet (100 mcg total) by mouth daily before breakfast. 05/26/14   Cornett,  Maisie Fus, MD  metoprolol succinate (TOPROL-XL) 100 MG 24 hr tablet Take 100 mg by mouth daily. Take with or immediately following a meal.    [provider]  Multiple Vitamin (MULTIVITAMIN) tablet Take 1 tablet by mouth daily.    [provider]  predniSONE (DELTASONE) 20 MG tablet Take 1 tablet (20 mg total) by mouth daily with breakfast. 10/04/18   Dhungel, Nishant, MD  rosuvastatin (CRESTOR) 20 MG tablet Take 20 mg by mouth every evening.     [provider]      Allergies    Codeine    Review of Systems   Review of Systems  All other systems reviewed and are negative.   Physical Exam Updated Vital Signs BP (!) 140/66 (BP Location: Right Arm)   Pulse 80   Temp 98.1 F (36.7 C) (Oral)   Resp 16   Ht 5\' 6"  (1.676 m)   Wt 59 kg   SpO2 100%   BMI 20.99 kg/m  Physical Exam Vitals and nursing note reviewed.  Constitutional:      Appearance: She is well-developed.  HENT:     Head: Normocephalic and atraumatic.  Eyes:     Extraocular Movements: Extraocular movements intact.     Pupils: Pupils are equal, round, and reactive to light.  Cardiovascular:     Rate and Rhythm: Normal rate.  Pulmonary:     Effort: Pulmonary effort is normal.  Musculoskeletal:  General: Swelling and tenderness present.     Cervical back: Normal range of motion and neck supple.     Right lower leg: Edema present.  Skin:    General: Skin is dry.  Neurological:     Mental Status: She is alert and oriented to person, place, and time.     ED Results / Procedures / Treatments   Labs (all labs ordered are listed, but only abnormal results are displayed) Labs Reviewed  BASIC METABOLIC PANEL - Abnormal; Notable for the following components:      Result Value   Glucose, Bld 104 (*)    GFR, Estimated 59 (*)    All other components within normal limits  CBC WITH DIFFERENTIAL/PLATELET    EKG None  Radiology CT Angio Chest PE W and/or Wo Contrast  Result Date:  12/29/2022 CLINICAL DATA:  Pulmonary embolism (PE) suspected, high prob EXAM: CT ANGIOGRAPHY CHEST WITH CONTRAST TECHNIQUE: Multidetector CT imaging of the chest was performed using the standard protocol during bolus administration of intravenous contrast. Multiplanar CT image reconstructions and MIPs were obtained to evaluate the vascular anatomy. RADIATION DOSE REDUCTION: This exam was performed according to the departmental dose-optimization program which includes automated exposure control, adjustment of the mA and/or kV according to patient size and/or use of iterative reconstruction technique. CONTRAST:  OMNIPAQUE IOHEXOL 350 MG/ML SOLN COMPARISON:  05/29/2014 FINDINGS: Cardiovascular: Satisfactory opacification of the pulmonary arteries. Single small web-like nonocclusive filling defect within the lobar branch pulmonary artery of the right lower lobe (series 7, image 139). No additional filling defects. Pulmonary trunk measures 3.2 cm in diameter. Thoracic aorta is nonaneurysmal. Scattered atherosclerotic vascular calcifications of the aorta and coronary arteries. Heart size within normal limits. No pericardial effusion. Mediastinum/Nodes: No enlarged mediastinal, hilar, or axillary lymph nodes. Thyroid gland, trachea, and esophagus demonstrate no significant findings. Lungs/Pleura: Bronchiectasis with bronchial wall thickening within the medial aspect of the left lower lobe with mucous impaction. Rounded pleural-based mass-like opacity at the posterior aspect of the right lower lobe measuring 2.9 x 2.1 x 2.3 cm (series 6, image 109). Biapical pleuroparenchymal scarring. No pleural effusion or pneumothorax. Upper Abdomen: No acute abnormality. Musculoskeletal: No chest wall abnormality. No acute or significant osseous findings. Review of the MIP images confirms the above findings. IMPRESSION: 1. 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 No additional filling defects to suggest acute pulmonary embolus. 2. Rounded pleural-based mass-like opacity at the posterior aspect of the right lower lobe measuring 2.9 x 2.1 x 2.3 cm. This is nonspecific and may represent a focal area of rounded atelectasis or pneumonia, however a pulmonary mass is not excluded. Follow-up CT in 3 months is recommended. 3. Bronchiectasis with bronchial wall thickening within the medial aspect of the left lower lobe with mucous impaction. Findings are favored to represent sequela of chronic infection or inflammation. 4. Aortic and coronary artery atherosclerosis (ICD10-I70.0). Electronically Signed   By: Duanne Guess D.O.   On: 12/29/2022 14:21   US Venous Img Lower Unilateral Right (DVT)  Result Date: 12/28/2022 CLINICAL DATA:  Right lower extremity pain and swelling EXAM: RIGHT LOWER EXTREMITY VENOUS DOPPLER ULTRASOUND TECHNIQUE: Gray-scale sonography with graded compression, as well as color Doppler and duplex ultrasound were performed to evaluate the lower extremity deep venous systems from the level of the common femoral vein and including the common femoral, femoral, profunda femoral, popliteal and calf veins  including the posterior tibial, peroneal and gastrocnemius veins when visible. The superficial great saphenous vein was also interrogated. Spectral Doppler was utilized to evaluate flow at rest and with distal augmentation maneuvers in the common femoral, femoral and popliteal veins. COMPARISON:  None Available. FINDINGS: Contralateral Common Femoral Vein: Respiratory phasicity is normal and symmetric with the symptomatic side. No evidence of thrombus. Normal compressibility. Common Femoral Vein: Thrombus is noted with decreased compressibility. Saphenofemoral Junction: Thrombus is noted with decreased compressibility. Profunda Femoral Vein: Thrombus is noted with decreased compressibility.  Femoral Vein: Thrombus is noted with decreased compressibility. Popliteal Vein: Thrombus is noted with decreased compressibility. Calf Veins: Thrombus is noted with decreased compressibility. Superficial Great Saphenous Vein: No evidence of thrombus. Normal compressibility. Venous Reflux:  None. Other Findings:  None. IMPRESSION: Diffuse deep venous thrombosis throughout the right lower extremity. These results will be called to the ordering clinician or representative by the Radiologist Assistant, and communication documented in the PACS or [employer]. Electronically Signed   By: Alcide Clever M.D.   On: 12/28/2022 23:34    Procedures Procedures    Medications Ordered in ED Medications  iohexol (OMNIPAQUE) 350 MG/ML injection 125 mL (125 mLs Intravenous Contrast Given 12/29/22 1405)    ED Course/ Medical Decision Making/ A&P                                 Medical Decision Making Amount and/or Complexity of Data Reviewed Labs: ordered. Radiology: ordered.  Risk Prescription drug management.   82 year old female comes in with chief complaint of right leg swelling and a positive DVT ultrasound in the outpatient setting.  I reviewed patient's records including the ultrasound from the outpatient setting.  Patient has a very large, diffuse DVT that extends all the way into the common femoral vein, we cannot see the end of the clot.  Additionally the patient and daughter indicates that she has had some episodes of weakness and shortness of breath with exertion.  Differential diagnosis for this patient includes acute DVT without complications like cerulea dolens, PE.   We will need to get a CT angio chest to rule out blood clot in the lungs and we will get CT venogram abdomen to see the extent of the clot.  This DVT appears to be unprovoked.  We will put her on anticoagulation.  Given how large it is, we will give her DVT clinic follow-up as well in the time of discharge.  3:03  PM The patient appears reasonably screened and/or stabilized for discharge and I doubt any other medical condition or other [facility] requiring further screening, evaluation, or treatment in the ED at this time prior to discharge.   Results from the ER workup discussed with the patient face to face and all questions answered to the best of my ability. The patient is safe for discharge with strict return precautions.   Final Clinical Impression(s) / ED Diagnoses Final diagnoses:  Acute deep vein thrombosis (DVT) of femoral vein of right lower extremity (HCC)  Pulmonary mass    Rx / DC Orders ED Discharge Orders          Ordered    RIVAROXABAN (XARELTO) VTE STARTER PACK (15 & 20 MG)        12/29/22 1455    AMB Referral to Deep Vein Thrombosis Clinic       Comments: Please call [phone] with any questions.   12/29/22 1459  Derwood Kaplan, MD 12/29/22 [phone]

## 2022-12-29 NOTE — Discharge Instructions (Addendum)
The ultrasound of your leg reveals a large blood clot.  Your CAT scan reveals a very small blood clot in the lung.  The CT scan also reveals pulmonary nodules that will need to be monitored by your primary care doctor.  You will need to follow-up with your primary care doctor for long-term management of your DVT.  Given how large the clot is, we have put in a referral to the vascular surgery clinic.  They will contact you with an appointment to see if any aggressive intervention are needed.

## 2022-12-31 ENCOUNTER — Other Ambulatory Visit (HOSPITAL_COMMUNITY): Payer: Self-pay

## 2023-01-11 ENCOUNTER — Ambulatory Visit
Admission: RE | Admit: 2023-01-11 | Discharge: 2023-01-11 | Disposition: A | Discharge status: Home / Self Care | Payer: Medicare HMO | Source: Ambulatory Visit | Attending: Student | Admitting: Student

## 2023-01-14 ENCOUNTER — Other Ambulatory Visit (HOSPITAL_COMMUNITY): Payer: Self-pay

## 2023-01-14 ENCOUNTER — Encounter (HOSPITAL_COMMUNITY): Payer: Self-pay

## 2023-01-14 ENCOUNTER — Ambulatory Visit (HOSPITAL_COMMUNITY)
Admission: RE | Admit: 2023-01-14 | Discharge: 2023-01-14 | Disposition: A | Discharge status: Home / Self Care | Payer: Medicare HMO | Source: Ambulatory Visit | Attending: Vascular Surgery | Admitting: Vascular Surgery

## 2023-01-14 VITALS — BP 187/80 | HR 68

## 2023-01-14 DIAGNOSIS — I82421 Acute embolism and thrombosis of right iliac vein: Secondary | ICD-10-CM

## 2023-01-14 MED ORDER — RIVAROXABAN 20 MG PO TABS
20.0000 mg | ORAL_TABLET | Freq: Every day | ORAL | 3 refills | Status: AC
Start: 2023-01-14 — End: ?
  Filled 2023-01-14: qty 90, 90d supply, fill #0

## 2023-01-14 NOTE — Progress Notes (Signed)
DVT Clinic Note  Name: [name]     MRN: [id number]     DOB: December 05, 1940     Sex: female  PCP: [name] Aldo, NP  Today's Visit: Visit Information: Initial Visit  Referred to DVT Clinic by: Emergency Department - Dr. Rhunette [name] Referred to CPP by: Dr. Chestine [name] Reason for referral:  Chief Complaint  Patient presents with   Med Management - DVT   HISTORY OF PRESENT ILLNESS: [name] is a 82 y.o. female who presents after diagnosis of DVT for medication management. She had experienced worsening RLE [name] for a month and was seen by her PCP at [facility]. Outpatient ultrasound showed extensive RLE DVT and they sent her to the ED on 12/29/22. CT venogram showed DVT extended as proximal as the external iliac vein. There was concern for possible PE. CT showed no acute PE but there was residual small chronic PE. Patient was unaware of any history of DVT or PE. She was started on Xarelto and referred to the DVT Clinic for follow up.   Today, patient presents in a wheelchair and is accompanied by her daughter [name].   Daughter [name] all below knee, not elevating or compression  Sleeps with legs   Positive Thrombotic Risk Factors: Previous VTE, Older Age, Other (comment) (decreased mobility) Bleeding Risk Factors: Age >65 years, Anticoagulant therapy  Negative Thrombotic Risk Factors: Recent surgery (within 3 months), Recent trauma (within 3 months), Recent admission to hospital with acute illness (within 3 months), Paralysis, paresis, or recent plaster cast immobilization of lower extremity, Central venous catheterization, Bed rest >72 hours within 3 months, Sedentary journey lasting >8 hours within 4 weeks, Pregnancy, Within 6 weeks postpartum, Recent cesarean section (within 3 months), Estrogen therapy, Testosterone therapy, Erythropoiesis-stimulating agent, Recent COVID diagnosis (within 3 months), Active cancer, Non-malignant, chronic inflammatory condition, Known  thrombophilic condition, Smoking, Obesity  Rx Insurance Coverage: Medicare Rx Affordability: Xarelto is $45/month Preferred Pharmacy: Home delivery from [facility]  Past Medical History:  Diagnosis Date   History of high cholesterol    Hypertension     Past Surgical History:  Procedure Laterality Date   ABDOMINAL HYSTERECTOMY     BACK SURGERY      X 2  "BEEN A WHILE"   BREAST SURGERY     REMOVAL OF CYST FROM RIGHT BREAST   THYROIDECTOMY N/A 05/25/2014   Procedure: COMPLETION THYROIDECTOMY;  Surgeon: [name] Bouillon, MD;  Location: [facility] OR;  Service: General;  Laterality: N/A;   THYROIDECTOMY, PARTIAL      Social History   Socioeconomic History   Marital status: Unknown    Spouse name: [name]   Number of children: Not on file   Years of education: Not on file   Highest education level: Not on file  Occupational History   Not on file  Tobacco Use   Smoking status: Former    Types: Cigarettes   Smokeless tobacco: Never  Vaping Use   Vaping status: Never Used  Substance and Sexual Activity   Alcohol use: Yes    Alcohol/week: 21.0 standard drinks of alcohol    Types: 21 Cans of beer per week   Drug use: No   Sexual activity: Not Currently    Partners: Male  Other Topics Concern   Not on file  Social History Narrative   ** Merged History Encounter **       Social Determinants of Health   Financial Resource Strain: Low Risk  (10/03/2018)  Overall Financial Resource Strain (CARDIA)    Difficulty of Paying Living Expenses: Not hard at all  Food Insecurity: Unknown (10/03/2018)   Hunger Vital Sign    Worried About Running Out of Food in the Last Year: Patient declined    Ran Out of Food in the Last Year: Patient declined  Transportation Needs: Unknown (10/03/2018)   PRAPARE - Administrator, Civil Service (Medical): Patient declined    Lack of Transportation (Non-Medical): Patient declined  Physical Activity: Unknown (10/03/2018)    Exercise Vital Sign    Days of Exercise per Week: Patient declined    Minutes of Exercise per Session: Patient declined  Stress: Not on file  Social Connections: Unknown (02/19/2022)   Received from [facility]   Social Network    Social Network: Not on file  Intimate Partner Violence: Unknown (02/19/2022)   Received from [facility]   HITS    Physically Hurt: Not on file    Insult or Talk Down To: Not on file    Threaten Physical Harm: Not on file    Scream or Curse: Not on file    No family history on file.  Allergies as of 01/14/2023 - Review Complete 01/14/2023  Allergen Reaction Noted   Codeine  07/14/2021    Current Outpatient Medications on File Prior to Encounter  Medication Sig Dispense Refill   PROPRANOLOL HCL PO Take by mouth. Unknown dose per daughter     RIVAROXABAN Carlena Hurl) VTE STARTER PACK (15 & 20 MG) Follow package directions: Take one 15mg  tablet by mouth twice a day. On day 22, switch to one 20mg  tablet once a day. Take with food. 51 each 0   No current facility-administered medications on file prior to encounter.   REVIEW OF SYSTEMS:  Review of Systems  Respiratory:  Negative for shortness of breath.   Cardiovascular:  Positive for leg [name]. Negative for chest pain and palpitations.  Musculoskeletal:  Negative for myalgias.  Neurological:  Negative for dizziness.   PHYSICAL EXAMINATION:  Vitals:   01/14/23 1430  BP: (!) 187/80  Pulse: 68  SpO2: 100%    Physical Exam Villalta Score for Post-Thrombotic Syndrome: Pain: Absent Cramps: Absent Heaviness: Absent Paresthesia: Absent Pruritus: Absent Pretibial Edema: Moderate Skin Induration: Absent Hyperpigmentation: Absent Redness: Mild Venous Ectasia: Absent Pain on calf compression: Absent Villalta Preliminary Score: 3 Is venous ulcer present?: No If venous ulcer is present and score is <15, then 15 points total are assigned: Absent Villalta Total Score: 3  LABS:  CBC      Component Value Date/Time   WBC 6.9 12/29/2022 1038   RBC 4.55 12/29/2022 1038   HGB 12.0 12/29/2022 1038   HCT 39.1 12/29/2022 1038   PLT 269 12/29/2022 1038   MCV 85.9 12/29/2022 1038   MCH 26.4 12/29/2022 1038   MCHC 30.7 12/29/2022 1038   RDW 14.8 12/29/2022 1038   LYMPHSABS 1.0 12/29/2022 1038   MONOABS 0.5 12/29/2022 1038   EOSABS 0.1 12/29/2022 1038   BASOSABS 0.0 12/29/2022 1038    Hepatic Function      Component Value Date/Time   PROT 6.8 12/08/2021 1230   ALBUMIN 3.0 (L) 05/26/2014 0605   AST 18 12/08/2021 1230   ALT 8 12/08/2021 1230   ALKPHOS 31 (L) 05/26/2014 0605   BILITOT 0.4 12/08/2021 1230    Renal Function   Lab Results  Component Value Date   CREATININE 0.96 12/29/2022   CREATININE 0.80 12/08/2021   CREATININE 0.87 12/01/2020  CrCl cannot be calculated (Unknown ideal weight.).   [facility] Vascular Lab Studies:  12/28/22 doppler FINDINGS: Contralateral Common Femoral Vein: Respiratory phasicity is normal and symmetric with the symptomatic side. No evidence of thrombus. Normal compressibility.   Common Femoral Vein: Thrombus is noted with decreased compressibility.   Saphenofemoral Junction: Thrombus is noted with decreased compressibility.   Profunda Femoral Vein: Thrombus is noted with decreased compressibility.   Femoral Vein: Thrombus is noted with decreased compressibility.   Popliteal Vein: Thrombus is noted with decreased compressibility.   Calf Veins: Thrombus is noted with decreased compressibility.   Superficial Great Saphenous Vein: No evidence of thrombus. Normal compressibility.   Venous Reflux:  None.   Other Findings:  None.   IMPRESSION: Diffuse deep venous thrombosis throughout the right lower extremity.  12/29/22 CT venogram IMPRESSION: 1. Thrombus within the right external iliac vein to the junction with the internal iliac vein. 2. Thrombus extends inferiorly within the superficial and deep femoral veins,  popliteal vein, and posterior tibial veins. 3. Asymmetric edema in the right lower extremity. 4. Intra and extrahepatic biliary dilation and pancreatic duct dilation without an obstructing lesion. 5. 2.9 x 1.7 x 2.2 cm dystrophic calcification within the right central abdomen. This may represent a calcified lymph node. 6. 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 7. Stable rounded opacity at the right lung base. 8. Bronchiectasis and peribronchial nodularity at the left base is again noted. This likely represents a chronic inflammatory process. 9.  Aortic Atherosclerosis (ICD10-I70.0).  ASSESSMENT: Location of DVT: Right iliac vein, Right common femoral vein, Right femoral vein, Right popliteal vein, Right distal vein Cause of DVT: unprovoked vs provoked by persistent risk factor of decreased mobility related to age  Given patient's extensive RLE DVT, discussed patient with vascular surgeon Dr. Chestine [name]. Given patient's age, frailty, immobility, and all symptoms being below the knee, no need for vascular intervention. Will continue medical management with Xarelto. This is the patient's second VTE event. CT showed small residual chronic PE that the patient was unaware of, now with extensive RLE DVT. Recommend indefinite anticoagulation as long as benefits outweigh risks. No need for hypercoagulable workup. She is not currently wearing compression stockings or elevating her legs. Discussed the importance of this in helping improve [name]. She is tolerating Xarelto well.   When we checked what next month's co-pay of Xarelto would be, it showed coverage was terminated. I asked the patient and her daughter if she still had her insurance plan and they informed me there was an issue on this year's drug coverage plan where they have the wrong birthday listed. When we override with the birthday on file it seems to go through and will be $45/month which is affordable for the  patient. They prefer to receive 90 day supplies at a time from our pharmacy that delivers to their home.   BP significantly elevated during visit today. No chest pain, headache, SOB, changes in vision, dizziness. She has follow up scheduled this Friday with her PCP and will address BP then.   PLAN: -Continue rivaroxaban (Xarelto) 15 mg twice daily with food for 21 days followed by 20 mg daily with food. -Expected duration of therapy: Indefinite. Therapy started on 12/29/22. -Patient educated on purpose, proper use and potential adverse effects of rivaroxaban (Xarelto). -Discussed importance of taking medication around the same time every day. -Advised patient of medications to avoid (NSAIDs, aspirin doses >100 mg daily). -Educated that Tylenol (acetaminophen) is the preferred analgesic  to lower the risk of bleeding. -Advised patient to alert all providers of anticoagulation therapy prior to starting a new medication or having a procedure. -Emphasized importance of monitoring for signs and symptoms of bleeding (abnormal bruising, prolonged bleeding, nose bleeds, bleeding from gums, discolored urine, black tarry stools). -Educated patient to present to the ED if emergent signs and symptoms of new thrombosis occur. -Counseled patient to wear compression stockings daily, removing at night. Elevate legs daily to help with [name].   Follow up: in January to ensure continued access to medication  Pervis Hocking, PharmD, BCACP, CPP Deep Vein Thrombosis Clinic Clinical Pharmacist Practitioner Office: [phone]

## 2023-01-14 NOTE — Patient Instructions (Addendum)
-  Continue rivaroxaban (Xarelto) 15 mg twice daily with food for 21 days followed by 20 mg daily with food. You need to continue this medication indefinitely as long as the benefits outweigh the risks - have you primary doctor reassess annually.  -Your refills have been sent to [facility] (our pharmacy that mails you the prescription). You may need to call the pharmacy to ask them to fill this when you start to run low on your current supply. [phone] -I have given you enough refills for one year. Your primary doctor will need to refill beyond that.  -The issue with your insurance is that your birthday should be February 07, 2041 and your Medicare plan has another date. Make sure you renew your Medicare plan to continue drug coverage for 2025. It is open enrollment through 02/16/23.  -It is very important to elevate your legs when you're sitting down AND wear compression stockings daily (take off at night) to help your swelling.  -It is important to take your medication around the same time every day.  -Avoid NSAIDs like ibuprofen (Advil, Motrin) and naproxen (Aleve) as well as aspirin doses over 100 mg daily. -Tylenol (acetaminophen) is the preferred over the counter pain medication to lower the risk of bleeding. -Be sure to alert all of your health care providers that you are taking an anticoagulant prior to starting a new medication or having a procedure. -Monitor for signs and symptoms of bleeding (abnormal bruising, prolonged bleeding, nose bleeds, bleeding from gums, discolored urine, black tarry stools). If you have fallen and hit your head OR if your bleeding is severe or not stopping, seek emergency care.  -Go to the emergency room if emergent signs and symptoms of new clot occur (new or worse swelling and pain in an arm or leg, shortness of breath, chest pain, fast or irregular heartbeats, lightheadedness, dizziness, fainting, coughing up blood) or if you experience a significant  color change (pale or blue) in the extremity that has the DVT.  -We recommend you wear compression stockings (15-20 mmHg) as long as you are having swelling or pain. Be sure to purchase the correct size and take them off at night.   Your next visit is on January 10th at 1:30PM.  [facility] [address] [place], [place], [place] [address] Enter the hospital through Entrance C off [facility] and pull up to the [facility] entrance to the free valet parking.  Check in for your appointment at the [facility].   If you have any questions or need to reschedule an appointment, please call [phone] [facility].  If you are having an emergency, call 911 or present to the nearest emergency room.   What is a DVT?  -Deep vein thrombosis (DVT) is a condition in which a blood clot forms in a vein of the deep venous system which can occur in the lower leg, thigh, pelvis, arm, or neck. This condition is serious and can be life-threatening if the clot travels to the arteries of the lungs and causing a blockage (pulmonary embolism, PE). A DVT can also damage veins in the leg, which can lead to long-term venous disease, leg pain, swelling, discoloration, and ulcers or sores (post-thrombotic syndrome).  -Treatment may include taking an anticoagulant medication to prevent more clots from forming and the current clot from growing, wearing compression stockings, and/or surgical procedures to remove or dissolve the clot.

## 2023-01-15 ENCOUNTER — Other Ambulatory Visit: Payer: Self-pay

## 2023-01-15 ENCOUNTER — Other Ambulatory Visit (HOSPITAL_COMMUNITY): Payer: Self-pay

## 2023-01-15 ENCOUNTER — Other Ambulatory Visit (HOSPITAL_BASED_OUTPATIENT_CLINIC_OR_DEPARTMENT_OTHER): Payer: Self-pay

## 2023-01-25 ENCOUNTER — Ambulatory Visit: Payer: Medicare HMO | Admitting: Podiatry

## 2023-01-25 ENCOUNTER — Encounter: Payer: Self-pay | Admitting: Podiatry

## 2023-01-25 DIAGNOSIS — M79675 Pain in left toe(s): Secondary | ICD-10-CM | POA: Diagnosis not present

## 2023-01-25 DIAGNOSIS — I739 Peripheral vascular disease, unspecified: Secondary | ICD-10-CM | POA: Diagnosis not present

## 2023-01-25 DIAGNOSIS — M79674 Pain in right toe(s): Secondary | ICD-10-CM | POA: Diagnosis not present

## 2023-01-25 DIAGNOSIS — B351 Tinea unguium: Secondary | ICD-10-CM | POA: Diagnosis not present

## 2023-01-25 NOTE — Progress Notes (Signed)
  Subjective:  Patient ID: [name], female    DOB: 30-Oct-1940,  MRN: [id number]  [name] presents to clinic today for painful elongated mycotic toenails 1-5 bilaterally which are tender when wearing enclosed shoe gear. Pain is relieved with periodic professional debridement.  New problem(s): None.   PCP is Hillery Aldo, NP , and last visit was last month.  Allergies  Allergen Reactions   Codeine     Other reaction(s): Unknown    Review of Systems: Negative except as noted in the HPI.  Objective: No changes noted in today's physical examination. Vascular Examination: CFT <4 seconds b/l. DP pulses diminished b/l. Unable to palpate PT pulses b/l. Digital hair absent. Skin temperature gradient warm to cool b/l. No ischemia or gangrene. No cyanosis or clubbing noted b/l.    Neurological Examination: Sensation grossly intact b/l with 10 gram monofilament. Vibratory sensation intact b/l.   Dermatological Examination: No open wounds b/l LE. No interdigital macerations noted b/l LE. Toenails 1-5 b/l elongated, discolored, dystrophic, thickened, crumbly with subungual debris and tenderness to dorsal palpation. Skin is noted to be bound down and leathery b/l. Healed bunion scar noted LLE. Porokeratotic lesion(s) right great toe, submet head 1 left foot, submet head 3 right foot, and submet head 5 left foot. No erythema, no edema, no drainage, no fluctuance.  Musculoskeletal Examination: Muscle strength 5/5 to b/l LE. No pain, crepitus or joint limitation noted with ROM bilateral LE. Hammertoe deformity noted 2-5 b/l.  Radiographs: None  Assessment/Plan: 1. Pain due to onychomycosis of toenails of both feet   2. PAD (peripheral artery disease) (HCC)     -Patient was evaluated and treated. All patient's and/or POA's questions/concerns answered on today's visit. -Ordered noninvasive arterial studies ABIs with and without TBIs for b/l lower extremities. -Mycotic toenails 1-5  bilaterally were debrided in length and girth with sterile nail nippers and dremel without incident. -Porokeratotic lesion(s) R 2nd toe, submet head 1 left foot, submet head 3 right foot, and submet head 5 left foot pared and enucleated with sterile scalpel blade without incident. Total number of lesions debrided=4. -Patient/POA to call should there be question/concern in the interim.   Return in about 3 months (around 04/27/2023).  Candelaria Stagers, DPM

## 2023-03-22 ENCOUNTER — Ambulatory Visit (HOSPITAL_COMMUNITY): Payer: Medicare HMO

## 2023-05-03 ENCOUNTER — Encounter: Payer: Self-pay | Admitting: Podiatry

## 2023-05-03 ENCOUNTER — Ambulatory Visit: Payer: Medicare HMO | Admitting: Podiatry

## 2023-05-03 DIAGNOSIS — M79674 Pain in right toe(s): Secondary | ICD-10-CM

## 2023-05-03 DIAGNOSIS — D689 Coagulation defect, unspecified: Secondary | ICD-10-CM | POA: Diagnosis not present

## 2023-05-03 DIAGNOSIS — B351 Tinea unguium: Secondary | ICD-10-CM

## 2023-05-03 DIAGNOSIS — R0989 Other specified symptoms and signs involving the circulatory and respiratory systems: Secondary | ICD-10-CM | POA: Diagnosis not present

## 2023-05-03 DIAGNOSIS — M79675 Pain in left toe(s): Secondary | ICD-10-CM | POA: Diagnosis not present

## 2023-05-03 NOTE — Progress Notes (Signed)
 This patient returns to my office for at risk foot care.  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 This patient is unable to cut nails herself since the patient cannot reach her nails.These nails are painful walking and wearing shoes.  This patient presents for at risk foot care today.  General Appearance  Alert, conversant and in no acute stress.  Vascular  Dorsalis pedis and posterior tibial  pulses are  weakly palpable  bilaterally.  Capillary return is within normal limits  bilaterally. Temperature is within normal limits  bilaterally.  Neurologic  Senn-Weinstein monofilament wire test within normal limits  bilaterally. Muscle power within normal limits bilaterally.  Nails Thick disfigured discolored nails with subungual debris  from hallux to fifth toes bilaterally. No evidence of bacterial infection or drainage bilaterally.  Orthopedic  No limitations of motion  feet .  No crepitus or effusions noted.  No bony pathology or digital deformities noted.  Skin  normotropic skin with no porokeratosis noted bilaterally.  No signs of infections or ulcers noted.     Onychomycosis  Pain in right toes  Pain in left toes  Consent was obtained for treatment procedures.   Mechanical debridement of nails 1-5  bilaterally performed with a nail nipper.  Filed with dremel without incident.    Return office visit    3 months                  Told patient to return for periodic foot care and evaluation due to potential at risk complications.   Helane Gunther DPM

## 2023-06-05 ENCOUNTER — Other Ambulatory Visit: Payer: Self-pay | Admitting: Student

## 2023-06-05 DIAGNOSIS — E0590 Thyrotoxicosis, unspecified without thyrotoxic crisis or storm: Secondary | ICD-10-CM

## 2023-06-17 ENCOUNTER — Other Ambulatory Visit

## 2023-08-09 ENCOUNTER — Encounter: Payer: Self-pay | Admitting: Podiatry

## 2023-08-09 ENCOUNTER — Ambulatory Visit: Payer: Medicare HMO | Admitting: Podiatry

## 2023-08-09 DIAGNOSIS — M79674 Pain in right toe(s): Secondary | ICD-10-CM

## 2023-08-09 DIAGNOSIS — M79675 Pain in left toe(s): Secondary | ICD-10-CM | POA: Diagnosis not present

## 2023-08-09 DIAGNOSIS — B351 Tinea unguium: Secondary | ICD-10-CM | POA: Diagnosis not present

## 2023-08-09 DIAGNOSIS — D689 Coagulation defect, unspecified: Secondary | ICD-10-CM | POA: Diagnosis not present

## 2023-08-09 NOTE — Progress Notes (Signed)
 This patient returns to my office for at risk foot care.  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 This patient is unable to cut nails herself since the patient cannot reach her nails.These nails are painful walking and wearing shoes.  This patient presents for at risk foot care today.  General Appearance  Alert, conversant and in no acute stress.  Vascular  Dorsalis pedis and posterior tibial  pulses are  weakly palpable  bilaterally.  Capillary return is within normal limits  bilaterally. Temperature is within normal limits  bilaterally.  Neurologic  Senn-Weinstein monofilament wire test within normal limits  bilaterally. Muscle power within normal limits bilaterally.  Nails Thick disfigured discolored nails with subungual debris  from hallux to fifth toes bilaterally. No evidence of bacterial infection or drainage bilaterally.  Orthopedic  No limitations of motion  feet .  No crepitus or effusions noted.  No bony pathology or digital deformities noted.  Skin  normotropic skin with no porokeratosis noted bilaterally.  No signs of infections or ulcers noted.     Onychomycosis  Pain in right toes  Pain in left toes  Consent was obtained for treatment procedures.   Mechanical debridement of nails 1-5  bilaterally performed with a nail nipper.  Filed with dremel without incident.    Return office visit    3 months                  Told patient to return for periodic foot care and evaluation due to potential at risk complications.   Helane Gunther DPM

## 2023-08-16 ENCOUNTER — Emergency Department (HOSPITAL_COMMUNITY)

## 2023-08-16 ENCOUNTER — Encounter (HOSPITAL_COMMUNITY): Payer: Self-pay

## 2023-08-16 ENCOUNTER — Emergency Department (HOSPITAL_COMMUNITY)
Admission: EM | Admit: 2023-08-16 | Discharge: 2023-08-16 | Disposition: A | Discharge status: Home / Self Care | Attending: Emergency Medicine | Admitting: Emergency Medicine

## 2023-08-16 ENCOUNTER — Other Ambulatory Visit: Payer: Self-pay

## 2023-08-16 DIAGNOSIS — R55 Syncope and collapse: Secondary | ICD-10-CM | POA: Insufficient documentation

## 2023-08-16 DIAGNOSIS — Z7901 Long term (current) use of anticoagulants: Secondary | ICD-10-CM | POA: Diagnosis not present

## 2023-08-16 LAB — CBC WITH DIFFERENTIAL/PLATELET
Abs Immature Granulocytes: 0 10*3/uL (ref 0.00–0.07)
Basophils Absolute: 0 10*3/uL (ref 0.0–0.1)
Basophils Relative: 1 %
Eosinophils Absolute: 0.1 10*3/uL (ref 0.0–0.5)
Eosinophils Relative: 3 %
HCT: 47.9 % — ABNORMAL HIGH (ref 36.0–46.0)
Hemoglobin: 15.1 g/dL — ABNORMAL HIGH (ref 12.0–15.0)
Immature Granulocytes: 0 %
Lymphocytes Relative: 20 %
Lymphs Abs: 0.8 10*3/uL (ref 0.7–4.0)
MCH: 26.4 pg (ref 26.0–34.0)
MCHC: 31.5 g/dL (ref 30.0–36.0)
MCV: 83.6 fL (ref 80.0–100.0)
Monocytes Absolute: 0.3 10*3/uL (ref 0.1–1.0)
Monocytes Relative: 8 %
Neutro Abs: 2.6 10*3/uL (ref 1.7–7.7)
Neutrophils Relative %: 68 %
Platelets: 161 10*3/uL (ref 150–400)
RBC: 5.73 MIL/uL — ABNORMAL HIGH (ref 3.87–5.11)
RDW: 15.1 % (ref 11.5–15.5)
WBC: 3.8 10*3/uL — ABNORMAL LOW (ref 4.0–10.5)
nRBC: 0 % (ref 0.0–0.2)

## 2023-08-16 LAB — COMPREHENSIVE METABOLIC PANEL WITH GFR
ALT: 8 U/L (ref 0–44)
AST: 16 U/L (ref 15–41)
Albumin: 3.1 g/dL — ABNORMAL LOW (ref 3.5–5.0)
Alkaline Phosphatase: 41 U/L (ref 38–126)
Anion gap: 9 (ref 5–15)
BUN: 12 mg/dL (ref 8–23)
CO2: 28 mmol/L (ref 22–32)
Calcium: 9.2 mg/dL (ref 8.9–10.3)
Chloride: 103 mmol/L (ref 98–111)
Creatinine, Ser: 0.71 mg/dL (ref 0.44–1.00)
GFR, Estimated: >60 mL/min (ref >60)
Glucose, Bld: 142 mg/dL — ABNORMAL HIGH (ref 70–99)
Potassium: 3.6 mmol/L (ref 3.5–5.1)
Sodium: 140 mmol/L (ref 135–145)
Total Bilirubin: 0.7 mg/dL (ref 0.0–1.2)
Total Protein: 6.3 g/dL — ABNORMAL LOW (ref 6.5–8.1)

## 2023-08-16 LAB — TROPONIN I (HIGH SENSITIVITY): Troponin I (High Sensitivity): 6 ng/L (ref <18)

## 2023-08-16 MED ORDER — SODIUM CHLORIDE 0.9 % IV SOLN: INTRAVENOUS | Status: DC

## 2023-08-16 NOTE — ED Provider Notes (Signed)
 [name] AT [facility] Provider Note   CSN: [id number] Arrival date & time: 08/16/23  4132     History  Chief Complaint  Patient presents with   Loss of Consciousness    [name] is a 83 y.o. female.  This is 83 year old female presents after unwitnessed syncopal event prior to arrival.  Patient states that she was dizzy and then she passed out.  Was lying on the couch when it happened.  When she woke, she denies any postictal period.  She did not have any tongue biting or loss of bladder control.  No recent medication changes.  Denies any recent illnesses.  She has not had any nausea vomiting or diarrhea.  EMS reports that apparently she was found on the couch by her son and she was staring straight up and was unresponsive for approximate 5 to 10 minutes.  Denies any prior history of seizures no history of trauma related to this event       Home Medications Prior to Admission medications   Medication Sig Start Date End Date Taking? Authorizing Provider  PROPRANOLOL HCL PO Take by mouth. Unknown dose per daughter    [provider]  rivaroxaban  (XARELTO ) 20 MG TABS tablet Take 1 tablet (20 mg total) by mouth daily with supper. Take with food. 01/14/23   Faye Hoops B, RPH-CPP      Allergies    Codeine    Review of Systems   Review of Systems  All other systems reviewed and are negative.   Physical Exam Updated Vital Signs BP (!) 194/52 (BP Location: Right Arm)   Pulse 65   Temp 97.6 F (36.4 C) (Oral)   Resp (!) 23   Ht 1.676 m (5\' 6" )   Wt 59 kg   SpO2 100%   BMI 20.99 kg/m  Physical Exam Vitals and nursing note reviewed.  Constitutional:      General: She is not in acute distress.    Appearance: Normal appearance. She is well-developed. She is not toxic-appearing.  HENT:     Head: Normocephalic and atraumatic.  Eyes:     General: Lids are normal.     Conjunctiva/sclera: Conjunctivae normal.     Pupils:  Pupils are equal, round, and reactive to light.  Neck:     Thyroid : No thyroid  mass.     Trachea: No tracheal deviation.  Cardiovascular:     Rate and Rhythm: Normal rate and regular rhythm.     Heart sounds: Normal heart sounds. No murmur heard.    No gallop.  Pulmonary:     Effort: Pulmonary effort is normal. No respiratory distress.     Breath sounds: Normal breath sounds. No stridor. No decreased breath sounds, wheezing, rhonchi or rales.  Abdominal:     General: There is no distension.     Palpations: Abdomen is soft.     Tenderness: There is no abdominal tenderness. There is no rebound.  Musculoskeletal:        General: No tenderness. Normal range of motion.     Cervical back: Normal range of motion and neck supple.  Skin:    General: Skin is warm and dry.     Findings: No abrasion or rash.  Neurological:     Mental Status: She is alert and oriented to person, place, and time. Mental status is at baseline.     GCS: GCS eye subscore is 4. GCS verbal subscore is 5. GCS motor subscore is  6.     Cranial Nerves: No cranial nerve deficit.     Sensory: No sensory deficit.     Motor: Motor function is intact.  Psychiatric:        Attention and Perception: Attention normal.        Speech: Speech normal.        Behavior: Behavior normal.     ED Results / Procedures / Treatments   Labs (all labs ordered are listed, but only abnormal results are displayed) Labs Reviewed  CBC WITH DIFFERENTIAL/PLATELET  COMPREHENSIVE METABOLIC PANEL WITH GFR    EKG EKG Interpretation Date/Time:  Friday August 16 2023 09:54:58 EDT Ventricular Rate:  64 PR Interval:  199 QRS Duration:  92 QT Interval:  426 QTC Calculation: 440 R Axis:   16  Text Interpretation: Sinus rhythm Probable left atrial enlargement Anteroseptal infarct, age indeterminate ST elevation, consider inferior injury No significant change since last tracing Confirmed by Lind Repine ([id number]) on 08/16/2023 10:02:34  AM  Radiology No results found.  Procedures Procedures    Medications Ordered in ED Medications  0.9 %  sodium chloride  infusion (has no administration in time range)    ED Course/ Medical Decision Making/ A&P                                 Medical Decision Making Amount and/or Complexity of Data Reviewed Labs: ordered. Radiology: ordered. ECG/medicine tests: ordered.  Risk Prescription drug management.   Patient's EKGs shows normal sinus rhythm.  No signs of heart block.  Troponin here is negative.  Head CT is also negative.  Patient admission for further evaluation of her syncope she has declined.  Will follow-up with her doctor        Final Clinical Impression(s) / ED Diagnoses Final diagnoses:  None    Rx / DC Orders ED Discharge Orders     None         Lind Repine, MD 08/16/23 1349

## 2023-08-16 NOTE — Discharge Instructions (Signed)
 You have been offered admission for the reason why he passed out and you have declined.  Follow-up with your doctor next week and return here for any problems

## 2023-08-16 NOTE — ED Triage Notes (Signed)
 Pt to ED via EMS with c/o syncope this am. Per EMS, pt was acting normal this morning, went to lay down on couch. Pt's son found pt on couch staring straight up and non-responsive for approx 5-34min. EMS called at 0840. Pt A&Ox4. Pt denies hx seizures or strokes.

## 2023-11-26 ENCOUNTER — Encounter (HOSPITAL_COMMUNITY): Payer: Self-pay

## 2023-11-26 ENCOUNTER — Emergency Department (HOSPITAL_COMMUNITY)
Admission: EM | Admit: 2023-11-26 | Discharge: 2023-11-27 | Discharge status: Against Medical Advice | Attending: Emergency Medicine | Admitting: Emergency Medicine

## 2023-11-26 ENCOUNTER — Other Ambulatory Visit: Payer: Self-pay

## 2023-11-26 DIAGNOSIS — Z5321 Procedure and treatment not carried out due to patient leaving prior to being seen by health care provider: Secondary | ICD-10-CM | POA: Insufficient documentation

## 2023-11-26 DIAGNOSIS — I10 Essential (primary) hypertension: Secondary | ICD-10-CM | POA: Diagnosis present

## 2023-11-26 LAB — COMPREHENSIVE METABOLIC PANEL WITH GFR
ALT: 8 U/L (ref 0–44)
AST: 16 U/L (ref 15–41)
Albumin: 3.3 g/dL — ABNORMAL LOW (ref 3.5–5.0)
Alkaline Phosphatase: 41 U/L (ref 38–126)
Anion gap: 9 (ref 5–15)
BUN: 13 mg/dL (ref 8–23)
CO2: 27 mmol/L (ref 22–32)
Calcium: 9.1 mg/dL (ref 8.9–10.3)
Chloride: 102 mmol/L (ref 98–111)
Creatinine, Ser: 0.62 mg/dL (ref 0.44–1.00)
GFR, Estimated: >60 mL/min (ref >60)
Glucose, Bld: 94 mg/dL (ref 70–99)
Potassium: 4.2 mmol/L (ref 3.5–5.1)
Sodium: 138 mmol/L (ref 135–145)
Total Bilirubin: 0.5 mg/dL (ref 0.0–1.2)
Total Protein: 6.6 g/dL (ref 6.5–8.1)

## 2023-11-26 LAB — CBC WITH DIFFERENTIAL/PLATELET
Abs Immature Granulocytes: 0 K/uL (ref 0.00–0.07)
Basophils Absolute: 0 K/uL (ref 0.0–0.1)
Basophils Relative: 1 %
Eosinophils Absolute: 0.1 K/uL (ref 0.0–0.5)
Eosinophils Relative: 3 %
HCT: 44.7 % (ref 36.0–46.0)
Hemoglobin: 13.6 g/dL (ref 12.0–15.0)
Immature Granulocytes: 0 %
Lymphocytes Relative: 22 %
Lymphs Abs: 0.8 K/uL (ref 0.7–4.0)
MCH: 25.7 pg — ABNORMAL LOW (ref 26.0–34.0)
MCHC: 30.4 g/dL (ref 30.0–36.0)
MCV: 84.5 fL (ref 80.0–100.0)
Monocytes Absolute: 0.3 K/uL (ref 0.1–1.0)
Monocytes Relative: 8 %
Neutro Abs: 2.6 K/uL (ref 1.7–7.7)
Neutrophils Relative %: 66 %
Platelets: 155 K/uL (ref 150–400)
RBC: 5.29 MIL/uL — ABNORMAL HIGH (ref 3.87–5.11)
RDW: 13.9 % (ref 11.5–15.5)
WBC: 3.9 K/uL — ABNORMAL LOW (ref 4.0–10.5)
nRBC: 0 % (ref 0.0–0.2)

## 2023-11-26 NOTE — ED Triage Notes (Signed)
 Pt had home visit from palliative care and they found her to be hypertensive in 200's/100's and advised that pt should come to ED>  Per daughter pt has been feeling well, no distress and has been taking her BP meds as prescribed.

## 2023-11-26 NOTE — ED Notes (Signed)
 Called pt 6x for vitals, and received no answer.

## 2023-11-26 NOTE — ED Provider Triage Note (Signed)
 Emergency Medicine Provider Triage Evaluation Note  TYMIKA GRILLI , a 83 y.o. female  was evaluated in triage.  Pt complains of asymptomatic hypertension for unknown period of time.  Took medicine this morning.  Review of Systems  Positive: Hypertension Negative: Chest pain shortness of breath numbness or tingling, difficulty walking  Physical Exam  BP (!) 217/86 (BP Location: Right Arm)   Pulse 78   Temp 98.1 F (36.7 C)   Resp 17   Ht 5' 6 (1.676 m)   Wt 44.5 kg   SpO2 100%   BMI 15.82 kg/m  Gen:   Awake, no distress   Resp:  Normal effort  MSK:   Moves extremities without difficulty  Other:    Medical Decision Making  Medically screening exam initiated at 2:21 PM.  Appropriate orders placed.  Arlyn JULIANNA Grade was informed that the remainder of the evaluation will be completed by another provider, this initial triage assessment does not replace that evaluation, and the importance of remaining in the ED until their evaluation is complete.     Shermon Warren SAILOR, PA-C 11/26/23 1421
